# Patient Record
Sex: Female | Born: 1985 | Race: White | Hispanic: No | Marital: Married | Smoking: Never smoker
Health system: Southern US, Community
[De-identification: ages and names within clinical notes are randomized; demographics above are authoritative.]

## PROBLEM LIST (undated history)

## (undated) ENCOUNTER — Emergency Department (HOSPITAL_COMMUNITY): Payer: Self-pay

## (undated) DIAGNOSIS — F419 Anxiety disorder, unspecified: Secondary | ICD-10-CM

## (undated) HISTORY — PX: WISDOM TOOTH EXTRACTION: SHX21

---
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
## 2017-12-02 DIAGNOSIS — Z01419 Encounter for gynecological examination (general) (routine) without abnormal findings: Secondary | ICD-10-CM | POA: Diagnosis not present

## 2017-12-02 DIAGNOSIS — Z113 Encounter for screening for infections with a predominantly sexual mode of transmission: Secondary | ICD-10-CM | POA: Diagnosis not present

## 2017-12-02 DIAGNOSIS — Z6825 Body mass index (BMI) 25.0-25.9, adult: Secondary | ICD-10-CM | POA: Diagnosis not present

## 2018-01-13 DIAGNOSIS — Z6825 Body mass index (BMI) 25.0-25.9, adult: Secondary | ICD-10-CM | POA: Diagnosis not present

## 2018-01-13 DIAGNOSIS — Z30430 Encounter for insertion of intrauterine contraceptive device: Secondary | ICD-10-CM | POA: Diagnosis not present

## 2018-01-13 DIAGNOSIS — Z3202 Encounter for pregnancy test, result negative: Secondary | ICD-10-CM | POA: Diagnosis not present

## 2018-07-21 DIAGNOSIS — Z0000 Encounter for general adult medical examination without abnormal findings: Secondary | ICD-10-CM | POA: Diagnosis not present

## 2018-07-21 DIAGNOSIS — R82998 Other abnormal findings in urine: Secondary | ICD-10-CM | POA: Diagnosis not present

## 2018-07-27 DIAGNOSIS — Z0000 Encounter for general adult medical examination without abnormal findings: Secondary | ICD-10-CM | POA: Diagnosis not present

## 2018-07-27 DIAGNOSIS — Z6826 Body mass index (BMI) 26.0-26.9, adult: Secondary | ICD-10-CM | POA: Diagnosis not present

## 2018-07-27 DIAGNOSIS — Z1389 Encounter for screening for other disorder: Secondary | ICD-10-CM | POA: Diagnosis not present

## 2018-07-27 DIAGNOSIS — Z23 Encounter for immunization: Secondary | ICD-10-CM | POA: Diagnosis not present

## 2019-01-04 DIAGNOSIS — Z01419 Encounter for gynecological examination (general) (routine) without abnormal findings: Secondary | ICD-10-CM | POA: Diagnosis not present

## 2019-01-04 DIAGNOSIS — Z113 Encounter for screening for infections with a predominantly sexual mode of transmission: Secondary | ICD-10-CM | POA: Diagnosis not present

## 2019-01-04 DIAGNOSIS — Z6826 Body mass index (BMI) 26.0-26.9, adult: Secondary | ICD-10-CM | POA: Diagnosis not present

## 2019-04-18 DIAGNOSIS — Z111 Encounter for screening for respiratory tuberculosis: Secondary | ICD-10-CM | POA: Diagnosis not present

## 2019-04-20 DIAGNOSIS — Z111 Encounter for screening for respiratory tuberculosis: Secondary | ICD-10-CM | POA: Diagnosis not present

## 2019-05-04 DIAGNOSIS — H6592 Unspecified nonsuppurative otitis media, left ear: Secondary | ICD-10-CM | POA: Diagnosis not present

## 2019-05-04 DIAGNOSIS — R42 Dizziness and giddiness: Secondary | ICD-10-CM | POA: Diagnosis not present

## 2019-11-22 DIAGNOSIS — Z03818 Encounter for observation for suspected exposure to other biological agents ruled out: Secondary | ICD-10-CM | POA: Diagnosis not present

## 2020-09-05 DIAGNOSIS — Z23 Encounter for immunization: Secondary | ICD-10-CM | POA: Diagnosis not present

## 2020-10-03 DIAGNOSIS — Z319 Encounter for procreative management, unspecified: Secondary | ICD-10-CM | POA: Diagnosis not present

## 2020-10-03 DIAGNOSIS — Z30431 Encounter for routine checking of intrauterine contraceptive device: Secondary | ICD-10-CM | POA: Diagnosis not present

## 2020-10-17 DIAGNOSIS — Z6825 Body mass index (BMI) 25.0-25.9, adult: Secondary | ICD-10-CM | POA: Diagnosis not present

## 2020-10-17 DIAGNOSIS — Z13220 Encounter for screening for lipoid disorders: Secondary | ICD-10-CM | POA: Diagnosis not present

## 2020-10-17 DIAGNOSIS — Z3009 Encounter for other general counseling and advice on contraception: Secondary | ICD-10-CM | POA: Diagnosis not present

## 2020-10-17 DIAGNOSIS — Z0000 Encounter for general adult medical examination without abnormal findings: Secondary | ICD-10-CM | POA: Diagnosis not present

## 2021-11-29 NOTE — L&D Delivery Note (Signed)
Delivery Note Patient progressed along a normal labor curve to 10 cm.  Due to multiple prolonged decelerations, she labored down for 30 minutes.  She pushed well for just over an hour.  At 10:18 PM a viable female was delivered via Vaginal, Spontaneous (Presentation: Right Occiput Anterior).  APGAR: 8, 9; weight 3010 gm (6lb 10.2oz) .   Placenta status: Spontaneous, Intact.  Cord: 3 vessels with the following complications: None.  Cord pH: n/a  At 2002 prior to pushing, patient was noted to have a temperature of 100.41F.  Maternal and fetal heart rates were normal.  She received 1 gm of tylenol PO, but had immediate emesis of the tablets.  MD was not notified of emesis.  At 2153 during pushing, temperature was 102.5 F with fetal tachycardia in the 170s.  She was redosed with 1 gm of tylenol and ampicillin 2gm ordered for possible early intraamniotic infection.  Gentamicin was not ordered as delivery was likely to occur prior to the arrival of gentamicin to the floor.     Following delivery, a vulvar biopsy was obtained.  The left labia was cleansed with betadine and a 55mm vulvar punch biopsy performed of the thickened hypopigmented lesion.  The tissue was sent to pathology.  There was brisk bleeding from the biopsy site so two interrupted 3-0 Vicryl rapide stitches were placed   Anesthesia: Epidural;Local Episiotomy: None Lacerations: 2nd degree Suture Repair: 2.0 3.0 vicryl rapide Est. Blood Loss (mL):  152 mL  Mom to postpartum.  Baby to Couplet care / Skin to Skin.  Michelle Padilla Michelle Padilla 07/21/2022, 11:06 PM

## 2021-12-15 LAB — OB RESULTS CONSOLE GC/CHLAMYDIA
Chlamydia: NEGATIVE
Neisseria Gonorrhea: NEGATIVE

## 2022-01-01 ENCOUNTER — Inpatient Hospital Stay (HOSPITAL_COMMUNITY): Payer: No Typology Code available for payment source

## 2022-01-01 ENCOUNTER — Encounter (HOSPITAL_COMMUNITY): Payer: Self-pay | Admitting: Emergency Medicine

## 2022-01-01 ENCOUNTER — Inpatient Hospital Stay (HOSPITAL_COMMUNITY)
Admission: AD | Admit: 2022-01-01 | Discharge: 2022-01-01 | Disposition: A | Discharge status: Home / Self Care | Payer: No Typology Code available for payment source | Attending: Obstetrics and Gynecology | Admitting: Obstetrics and Gynecology

## 2022-01-01 ENCOUNTER — Other Ambulatory Visit: Payer: Self-pay

## 2022-01-01 DIAGNOSIS — Z3491 Encounter for supervision of normal pregnancy, unspecified, first trimester: Secondary | ICD-10-CM | POA: Diagnosis not present

## 2022-01-01 DIAGNOSIS — Z6740 Type O blood, Rh positive: Secondary | ICD-10-CM | POA: Insufficient documentation

## 2022-01-01 DIAGNOSIS — O209 Hemorrhage in early pregnancy, unspecified: Secondary | ICD-10-CM | POA: Insufficient documentation

## 2022-01-01 DIAGNOSIS — N939 Abnormal uterine and vaginal bleeding, unspecified: Secondary | ICD-10-CM

## 2022-01-01 DIAGNOSIS — Z3A10 10 weeks gestation of pregnancy: Secondary | ICD-10-CM | POA: Insufficient documentation

## 2022-01-01 DIAGNOSIS — O09511 Supervision of elderly primigravida, first trimester: Secondary | ICD-10-CM | POA: Diagnosis not present

## 2022-01-01 HISTORY — DX: Anxiety disorder, unspecified: F41.9

## 2022-01-01 LAB — URINALYSIS, ROUTINE W REFLEX MICROSCOPIC
Bilirubin Urine: NEGATIVE
Glucose, UA: NEGATIVE mg/dL
Ketones, ur: NEGATIVE mg/dL
Leukocytes,Ua: NEGATIVE
Nitrite: NEGATIVE
Protein, ur: NEGATIVE mg/dL
Specific Gravity, Urine: >1.03 — ABNORMAL HIGH (ref 1.005–1.030)
pH: 6 (ref 5.0–8.0)

## 2022-01-01 LAB — CBC
HCT: 36.5 % (ref 36.0–46.0)
Hemoglobin: 12.8 g/dL (ref 12.0–15.0)
MCH: 27.8 pg (ref 26.0–34.0)
MCHC: 35.1 g/dL (ref 30.0–36.0)
MCV: 79.2 fL — ABNORMAL LOW (ref 80.0–100.0)
Platelets: 133 10*3/uL — ABNORMAL LOW (ref 150–400)
RBC: 4.61 MIL/uL (ref 3.87–5.11)
RDW: 13.2 % (ref 11.5–15.5)
WBC: 6.3 10*3/uL (ref 4.0–10.5)
nRBC: 0 % (ref 0.0–0.2)

## 2022-01-01 LAB — ABO/RH: ABO/RH(D): O POS

## 2022-01-01 LAB — HEMOGLOBIN AND HEMATOCRIT, BLOOD
HCT: 35.9 % — ABNORMAL LOW (ref 36.0–46.0)
Hemoglobin: 12.5 g/dL (ref 12.0–15.0)

## 2022-01-01 LAB — URINALYSIS, MICROSCOPIC (REFLEX): RBC / HPF: >50 RBC/hpf (ref 0–5)

## 2022-01-01 LAB — I-STAT BETA HCG BLOOD, ED (MC, WL, AP ONLY): I-stat hCG, quantitative: >2000 m[IU]/mL — ABNORMAL HIGH (ref <5)

## 2022-01-01 LAB — HCG, QUANTITATIVE, PREGNANCY: hCG, Beta Chain, Quant, S: 177250 m[IU]/mL — ABNORMAL HIGH (ref <5)

## 2022-01-01 MED ORDER — ONDANSETRON 4 MG PO TBDP
4.0000 mg | ORAL_TABLET | Freq: Once | ORAL | Status: AC
  Administered 2022-01-01: 4 mg via ORAL
  Filled 2022-01-01: qty 1

## 2022-01-01 NOTE — ED Notes (Signed)
Pt given urine sample now.

## 2022-01-01 NOTE — ED Provider Triage Note (Signed)
Emergency Medicine Provider Triage Evaluation Note  Michelle Padilla , a 36 y.o. female  was evaluated in triage.  Pt complains of vaginal bleeding onset last night.  She is currently approximately [redacted] weeks pregnant.  Her OB is Dr. Carlis Abbott at [facility].  She had her first OB appointment 2 and half weeks ago. She is G1, P0.  Denies abdominal pain, nausea, vomiting.  Review of Systems  Positive: As per HPI above Negative: Abdominal pain, nausea, vomiting  Physical Exam  BP 136/89 (BP Location: Left Arm)    Pulse (!) 107    Temp 98.5 F (36.9 C)    Resp 14    LMP 10/19/2021 (Exact Date)    SpO2 100%  Gen:   Awake, no distress, tearful during exam. Resp:  Normal effort  MSK:   Moves extremities without difficulty  Other:  No abdominal tenderness to palpation.  Medical Decision Making  Medically screening exam initiated at 2:22 PM.  Appropriate orders placed.  Michelle Padilla was informed that the remainder of the evaluation will be completed by another provider, this initial triage assessment does not replace that evaluation, and the importance of remaining in the ED until their evaluation is complete.  2:51 PM -called MAU and spoke with Michelle Padilla who accepts the patient in transfer.     Michelle Padilla A, PA-C 01/01/22 1452

## 2022-01-01 NOTE — MAU Note (Signed)
Presents with c/o heavy VB that began approximately 3 hours, states no abdominal pain/cramping but has passed 3 egg sized clots.  LMP 10/19/2021.  +HPT.

## 2022-01-01 NOTE — ED Triage Notes (Signed)
Patient reports heavy vaginal bleeding since 1230 today. Denies any pain, LMP 10/19/2022. G1P0. Patient alert, oriented, tearful at this time.

## 2022-01-01 NOTE — MAU Provider Note (Signed)
History     CSN: [id number]  Arrival date and time: 01/01/22 1342   Event Date/Time   First Provider Initiated Contact with Patient 01/01/22 1819      Chief Complaint  Patient presents with   Vaginal Bleeding   HPI [place] is a 36 y.o. G1P0 at [redacted]w[redacted]d who presents to MAU from [facility] for evaluation of heavy vaginal bleeding. This is a new problem, onset earlier this afternoon. Patient reports passing 3-4 egg-sized clots bout three hours hour prior to her arrival at [facility]. She has not experienced pain at any time. She reports live IUP in office during her first prenatal visit about two weeks ago. She denies dizziness, weakness, syncope.  Patient has initiated care with [facility].    OB History     Gravida  1   Para      Term      Preterm      AB      Living         SAB      IAB      Ectopic      Multiple      Live Births              Past Medical History:  Diagnosis Date   Anxiety       Family History  Problem Relation Age of Onset   Hypertension Mother    Seizures Father     Social History   Tobacco Use   Smoking status: Never   Smokeless tobacco: Never  Vaping Use   Vaping Use: Never used  Substance Use Topics   Alcohol use: Not Currently    Comment: Social   Drug use: Never    Allergies: No Known Allergies  Medications Prior to Admission  Medication Sig Dispense Refill Last Dose   Prenatal Vit-Fe Fumarate-FA (MULTIVITAMIN-PRENATAL) 27-0.8 MG TABS tablet Take 1 tablet by mouth daily at 12 noon.   12/31/2021 at 1800    Review of Systems  Genitourinary:  Positive for vaginal bleeding.  All other systems reviewed and are negative. Physical Exam   Blood pressure 120/90, pulse (!) 124, temperature 98.8 F (37.1 C), temperature source Oral, resp. rate 16, last menstrual period 10/19/2021, SpO2 100 %.  Physical Exam Vitals and nursing note reviewed. Exam conducted with a chaperone present.  Constitutional:      Appearance:  Normal appearance.  Cardiovascular:     Rate and Rhythm: Normal rate and regular rhythm.     Pulses: Normal pulses.     Heart sounds: Normal heart sounds.  Pulmonary:     Effort: Pulmonary effort is normal.     Breath sounds: Normal breath sounds.  Abdominal:     Palpations: Abdomen is soft.     Tenderness: There is no right CVA tenderness or left CVA tenderness.  Genitourinary:    Comments: Pelvic exam: External genitalia normal, vaginal walls pink and well rugated, cervix visually closed, no lesions noted. One large dark red clot removed with fox swab x 1. Scant active bleeding, cervix visually closed.    Skin:    Capillary Refill: Capillary refill takes less than 2 seconds.  Neurological:     Mental Status: She is alert.  Psychiatric:        Mood and Affect: Mood normal.        Behavior: Behavior normal.        Thought Content: Thought content normal.        Judgment: Judgment  normal.    MAU Course/MDM  Procedures  --Patient with one episode of vomiting. Given Zofran ODT. Patient reports she typically has emesis when she gets hungry. Declines outpatient prescription for antiemetic  Orders Placed This Encounter  Procedures   US OB Comp Less 14 Wks   CBC   hCG, quantitative, pregnancy   Urinalysis, Routine w reflex microscopic Urine, Clean Catch   Urinalysis, Microscopic (reflex)   Hemoglobin and hematocrit, blood   Diet NPO time specified   I-Stat beta hCG blood, ED   ABO/Rh   Discharge patient   Patient Vitals for the past 24 hrs:  BP Temp Temp src Pulse Resp SpO2  01/01/22 1840 120/90 -- -- (!) 124 -- 100 %  01/01/22 1531 114/79 98.8 F (37.1 C) Oral (!) 102 16 100 %  01/01/22 1350 136/89 98.5 F (36.9 C) -- (!) 107 14 100 %   Results for orders placed or performed during the hospital encounter of 01/01/22 (from the past 24 hour(s))  I-Stat beta hCG blood, ED     Status: Abnormal   Collection Time: 01/01/22  2:36 PM  Result Value Ref Range   I-stat hCG,  quantitative >2,000.0 (H) <5 mIU/mL   Comment 3          Urinalysis, Routine w reflex microscopic Urine, Clean Catch     Status: Abnormal   Collection Time: 01/01/22  3:35 PM  Result Value Ref Range   Color, Urine YELLOW YELLOW   APPearance CLEAR CLEAR   Specific Gravity, Urine >1.030 (H) 1.005 - 1.030   pH 6.0 5.0 - 8.0   Glucose, UA NEGATIVE NEGATIVE mg/dL   Hgb urine dipstick LARGE (A) NEGATIVE   Bilirubin Urine NEGATIVE NEGATIVE   Ketones, ur NEGATIVE NEGATIVE mg/dL   Protein, ur NEGATIVE NEGATIVE mg/dL   Nitrite NEGATIVE NEGATIVE   Leukocytes,Ua NEGATIVE NEGATIVE  Urinalysis, Microscopic (reflex)     Status: Abnormal   Collection Time: 01/01/22  3:35 PM  Result Value Ref Range   RBC / HPF >50 0 - 5 RBC/hpf   WBC, UA 6-10 0 - 5 WBC/hpf   Bacteria, UA RARE (A) NONE SEEN   Squamous Epithelial / LPF 0-5 0 - 5   Mucus PRESENT    Ca Oxalate Crys, UA PRESENT   CBC     Status: Abnormal   Collection Time: 01/01/22  4:31 PM  Result Value Ref Range   WBC 6.3 4.0 - 10.5 K/uL   RBC 4.61 3.87 - 5.11 MIL/uL   Hemoglobin 12.8 12.0 - 15.0 g/dL   HCT 36.5 36.0 - 46.0 %   MCV 79.2 (L) 80.0 - 100.0 fL   MCH 27.8 26.0 - 34.0 pg   MCHC 35.1 30.0 - 36.0 g/dL   RDW 13.2 11.5 - 15.5 %   Platelets 133 (L) 150 - 400 K/uL   nRBC 0.0 0.0 - 0.2 %  hCG, quantitative, pregnancy     Status: Abnormal   Collection Time: 01/01/22  4:31 PM  Result Value Ref Range   hCG, Beta Chain, Quant, S 177,250 (H) <5 mIU/mL  ABO/Rh     Status: None   Collection Time: 01/01/22  4:31 PM  Result Value Ref Range   ABO/RH(D) O POS    No rh immune globuloin      NOT A RH IMMUNE GLOBULIN CANDIDATE, PT RH POSITIVE Performed at [facility], [address], [place], [place] [address]   Hemoglobin and hematocrit, blood  Status: Abnormal   Collection Time: 01/01/22  6:28 PM  Result Value Ref Range   Hemoglobin 12.5 12.0 - 15.0 g/dL   HCT 35.9 (L) 36.0 - 46.0 %   US OB Comp Less 14 Wks  Result Date:  01/01/2022 CLINICAL DATA:  Heavy bleeding, pregnant EXAM: OBSTETRIC <14 WK ULTRASOUND TECHNIQUE: Transabdominal ultrasound was performed for evaluation of the gestation as well as the maternal uterus and adnexal regions. COMPARISON:  None. FINDINGS: Intrauterine gestational sac: Single Yolk sac:  Visualized. Embryo:  Visualized. Cardiac Activity: Visualized. Heart Rate: 182 bpm CRL:   36.3 mm   10 w 4 d                  [place] EDC: 07/06/2022 Subchorionic hemorrhage:  None visualized. Maternal uterus/adnexae: Right ovary is normal. Left ovary is not visualized. Blood product in the vaginal vault. IMPRESSION: 1. Single intrauterine gestation at sonographic gestational age of [redacted] weeks, 4 days. Fetal heart rate 182 beats per minute. EDD 07/26/2022. 2.  Blood product in the vaginal vault. Electronically Signed   By: Delanna Ahmadi M.D.   On: 01/01/2022 18:12    Assessment and Plan  --36 y.o. G1P0 at [redacted]w[redacted]d  --Live IUP confirmed during MAU encounter --Ongoing scant but consistent bleeding --Reviewed bleeding precautions, summarized in AVS --Rechecked H&H prior to discharge 12.8>12.5 --Blood type O POS --Dr. Marvel Plan notified of assessment, possible threatened miscarriage --Discharge home in stable condition  Grafton Folk, MSN, CNM

## 2022-01-06 LAB — OB RESULTS CONSOLE RUBELLA ANTIBODY, IGM: Rubella: IMMUNE

## 2022-01-06 LAB — OB RESULTS CONSOLE RPR: RPR: NONREACTIVE

## 2022-01-06 LAB — HEPATITIS C ANTIBODY: HCV Ab: NEGATIVE

## 2022-01-06 LAB — OB RESULTS CONSOLE HIV ANTIBODY (ROUTINE TESTING): HIV: NONREACTIVE

## 2022-01-06 LAB — OB RESULTS CONSOLE ANTIBODY SCREEN: Antibody Screen: NEGATIVE

## 2022-01-06 LAB — OB RESULTS CONSOLE HEPATITIS B SURFACE ANTIGEN: Hepatitis B Surface Ag: NEGATIVE

## 2022-03-02 ENCOUNTER — Other Ambulatory Visit: Payer: Self-pay | Admitting: Obstetrics

## 2022-03-02 DIAGNOSIS — Z3689 Encounter for other specified antenatal screening: Secondary | ICD-10-CM

## 2022-03-04 ENCOUNTER — Other Ambulatory Visit: Payer: Self-pay

## 2022-03-09 ENCOUNTER — Ambulatory Visit
Payer: No Typology Code available for payment source | Attending: Maternal & Fetal Medicine | Admitting: Maternal & Fetal Medicine

## 2022-03-09 ENCOUNTER — Other Ambulatory Visit: Payer: Self-pay | Admitting: *Deleted

## 2022-03-09 ENCOUNTER — Ambulatory Visit (HOSPITAL_BASED_OUTPATIENT_CLINIC_OR_DEPARTMENT_OTHER): Payer: No Typology Code available for payment source

## 2022-03-09 ENCOUNTER — Ambulatory Visit: Payer: No Typology Code available for payment source | Attending: Obstetrics | Admitting: *Deleted

## 2022-03-09 ENCOUNTER — Encounter: Payer: Self-pay | Admitting: *Deleted

## 2022-03-09 VITALS — BP 111/72 | HR 81 | Ht 64.0 in

## 2022-03-09 DIAGNOSIS — Z7982 Long term (current) use of aspirin: Secondary | ICD-10-CM | POA: Diagnosis not present

## 2022-03-09 DIAGNOSIS — Q369 Cleft lip, unilateral: Secondary | ICD-10-CM

## 2022-03-09 DIAGNOSIS — Z3A20 20 weeks gestation of pregnancy: Secondary | ICD-10-CM | POA: Insufficient documentation

## 2022-03-09 DIAGNOSIS — O09522 Supervision of elderly multigravida, second trimester: Secondary | ICD-10-CM | POA: Insufficient documentation

## 2022-03-09 DIAGNOSIS — O283 Abnormal ultrasonic finding on antenatal screening of mother: Secondary | ICD-10-CM

## 2022-03-09 DIAGNOSIS — Z363 Encounter for antenatal screening for malformations: Secondary | ICD-10-CM | POA: Diagnosis present

## 2022-03-09 DIAGNOSIS — Z3689 Encounter for other specified antenatal screening: Secondary | ICD-10-CM

## 2022-03-09 DIAGNOSIS — Z362 Encounter for other antenatal screening follow-up: Secondary | ICD-10-CM

## 2022-03-09 DIAGNOSIS — O35AXX0 Maternal care for other (suspected) fetal abnormality and damage, fetal facial anomalies, not applicable or unspecified: Secondary | ICD-10-CM

## 2022-03-09 NOTE — Progress Notes (Signed)
MFM Consultation. ? ?Michelle Padilla is a 36 yo G1P0 at 24 w 1 d who is were with an EDD of 07/26/22. She is seen at the request of Royetta Crochet, MD regarding a finding of unilateral cleft lip. ? ?She is doing well today without s/sx of preterm labor or preeclampsia. ? ?She a low risk NIPS and AFP. ? ?Her Blood pressure was 111/72. ? ?Past Medical History:  ?Diagnosis Date  ? Anxiety   ? ?Past Surgical History:  ?Procedure Laterality Date  ? WISDOM TOOTH EXTRACTION Bilateral   ? ? ? ? ? ?Current Outpatient Medications (Analgesics):  ?  aspirin EC 81 MG tablet, Take 81 mg by mouth daily. Swallow whole. ? ? ?Current Outpatient Medications (Other):  ?  Prenatal Vit-Fe Fumarate-FA (MULTIVITAMIN-PRENATAL) 27-0.8 MG TABS tablet, Take 1 tablet by mouth daily at 12 noon. ?  VITAMIN D PO, Take by mouth. ?Social History  ? ?Socioeconomic History  ? Marital status: Married  ?  Spouse name: Not on file  ? Number of children: Not on file  ? Years of education: Not on file  ? Highest education level: Not on file  ?Occupational History  ? Not on file  ?Tobacco Use  ? Smoking status: Never  ? Smokeless tobacco: Never  ?Vaping Use  ? Vaping Use: Never used  ?Substance and Sexual Activity  ? Alcohol use: Not Currently  ?  Comment: Social  ? Drug use: Never  ? Sexual activity: Yes  ?Other Topics Concern  ? Not on file  ?Social History Narrative  ? Not on file  ? ?Social Determinants of Health  ? ?Financial Resource Strain: Not on file  ?Food Insecurity: Not on file  ?Transportation Needs: Not on file  ?Physical Activity: Not on file  ?Stress: Not on file  ?Social Connections: Not on file  ?Intimate Partner Violence: Not on file  ? ?No Known Allergies ?Family History  ?Problem Relation Age of Onset  ? Hypertension Mother   ? Seizures Father   ? ? ?Imaging today: ? ?We observed a single intrauterine pregnancy with measurements consistent with dates. ?The fetal anatomy was normal with exception of a right unilateral cleft lip. The fetal  palate was suboptimally seen. ?Suboptimal views of the fetal heart was also observed.  ? ?There was good fetal movement and amniotic fluid volume. ? ?Impression/Counseling: ? ?Cleft lip and palate is a common facial malformation that typically runs between the nostrils and may involve the central part of the posterior palate. Linear defect extending from upper lip to the nostril, while midline clefts are often associated with brain malformations (e.g., holoprosencephaly). The majority of cases have a multifactorial etiology and inheritance. Additional risk factors, including hyperthermia, chronic steroids, methotrexate, alcohol, hydantoin, trimethadione, and aminopterin, maternal rubella, phenylketonuria, folic acid deficiency, and zinc deficiency were reviewed.  ? ?In general, the prognosis for a good cosmetic and functional repair with isolated cleft lip and/or cleft palate is excellent.  Otherwise, the prognosis is dependent upon any associated malformations or a syndrome diagnosis.  The risks, benefits, and alternatives of invasive karyotype analysis in the presence of a cleft lip were reviewed with your patient. We quote a risk for fetal loss as a result of an amniocentesis of approximately 1/500 and highlighted that normal test results do not necessarily guarantee the birth of a normal infant as the general population risk of having a child with any congenital abnormality is approximately 3-4%.  At the conclusion of our discussion, Ms. Kruschke made an  informed decision to forgo further testing.   ? ?At this time we recommend follow-up in 4 weeks, at which time for fetal growth assessment.  Thereafter, serial ultrasounds for growth, as well as evaluation of polyhydramnios may be considered.  As far as delivery, the site for delivery should be where there are appropriate facilities and support staff for care and management of an infant with a cleft (possible respiratory and feeding complications).   ? ?All  questions answered.  ? ?I spent 30 minutes with > 50% in face to face consultation. ? ?Vikki Ports, MD ?

## 2022-03-24 ENCOUNTER — Other Ambulatory Visit: Payer: No Typology Code available for payment source

## 2022-03-24 ENCOUNTER — Ambulatory Visit: Payer: No Typology Code available for payment source

## 2022-04-09 ENCOUNTER — Ambulatory Visit: Payer: No Typology Code available for payment source | Attending: Maternal & Fetal Medicine

## 2022-04-09 ENCOUNTER — Ambulatory Visit: Payer: No Typology Code available for payment source | Admitting: *Deleted

## 2022-04-09 ENCOUNTER — Encounter: Payer: Self-pay | Admitting: *Deleted

## 2022-04-09 VITALS — BP 122/65 | HR 71

## 2022-04-09 DIAGNOSIS — O09522 Supervision of elderly multigravida, second trimester: Secondary | ICD-10-CM

## 2022-04-09 DIAGNOSIS — O09512 Supervision of elderly primigravida, second trimester: Secondary | ICD-10-CM | POA: Diagnosis present

## 2022-04-09 DIAGNOSIS — Z3A24 24 weeks gestation of pregnancy: Secondary | ICD-10-CM

## 2022-04-09 DIAGNOSIS — Z362 Encounter for other antenatal screening follow-up: Secondary | ICD-10-CM | POA: Insufficient documentation

## 2022-04-09 DIAGNOSIS — O35AXX0 Maternal care for other (suspected) fetal abnormality and damage, fetal facial anomalies, not applicable or unspecified: Secondary | ICD-10-CM

## 2022-05-24 DIAGNOSIS — O35AXX1 Maternal care for other (suspected) fetal abnormality and damage, fetal facial anomalies, fetus 1: Secondary | ICD-10-CM | POA: Insufficient documentation

## 2022-07-02 LAB — OB RESULTS CONSOLE GBS: GBS: NEGATIVE

## 2022-07-14 ENCOUNTER — Telehealth (HOSPITAL_COMMUNITY): Payer: Self-pay | Admitting: *Deleted

## 2022-07-14 ENCOUNTER — Encounter (HOSPITAL_COMMUNITY): Payer: Self-pay | Admitting: *Deleted

## 2022-07-14 ENCOUNTER — Encounter (HOSPITAL_COMMUNITY): Payer: Self-pay

## 2022-07-14 NOTE — Telephone Encounter (Signed)
Preadmission screen  

## 2022-07-20 ENCOUNTER — Other Ambulatory Visit: Payer: Self-pay

## 2022-07-21 ENCOUNTER — Inpatient Hospital Stay (HOSPITAL_COMMUNITY): Payer: No Typology Code available for payment source

## 2022-07-21 ENCOUNTER — Encounter (HOSPITAL_COMMUNITY): Payer: Self-pay | Admitting: Obstetrics and Gynecology

## 2022-07-21 ENCOUNTER — Inpatient Hospital Stay (HOSPITAL_COMMUNITY): Payer: No Typology Code available for payment source | Admitting: Anesthesiology

## 2022-07-21 ENCOUNTER — Inpatient Hospital Stay (HOSPITAL_COMMUNITY)
Admission: AD | Admit: 2022-07-21 | Discharge: 2022-07-23 | DRG: 768 | Disposition: A | Discharge status: Home / Self Care | Payer: No Typology Code available for payment source | Attending: Obstetrics | Admitting: Obstetrics

## 2022-07-21 DIAGNOSIS — Z3A39 39 weeks gestation of pregnancy: Secondary | ICD-10-CM | POA: Diagnosis not present

## 2022-07-21 DIAGNOSIS — Z3490 Encounter for supervision of normal pregnancy, unspecified, unspecified trimester: Secondary | ICD-10-CM

## 2022-07-21 DIAGNOSIS — O2686 Pruritic urticarial papules and plaques of pregnancy (PUPPP): Secondary | ICD-10-CM | POA: Diagnosis present

## 2022-07-21 DIAGNOSIS — N9089 Other specified noninflammatory disorders of vulva and perineum: Secondary | ICD-10-CM | POA: Diagnosis present

## 2022-07-21 DIAGNOSIS — O411230 Chorioamnionitis, third trimester, not applicable or unspecified: Secondary | ICD-10-CM | POA: Diagnosis present

## 2022-07-21 DIAGNOSIS — O3473 Maternal care for abnormality of vulva and perineum, third trimester: Principal | ICD-10-CM | POA: Diagnosis present

## 2022-07-21 DIAGNOSIS — O358XX0 Maternal care for other (suspected) fetal abnormality and damage, not applicable or unspecified: Secondary | ICD-10-CM | POA: Diagnosis present

## 2022-07-21 DIAGNOSIS — O26893 Other specified pregnancy related conditions, third trimester: Secondary | ICD-10-CM | POA: Diagnosis present

## 2022-07-21 LAB — CBC
HCT: 36.2 % (ref 36.0–46.0)
Hemoglobin: 11.5 g/dL — ABNORMAL LOW (ref 12.0–15.0)
MCH: 23 pg — ABNORMAL LOW (ref 26.0–34.0)
MCHC: 31.8 g/dL (ref 30.0–36.0)
MCV: 72.3 fL — ABNORMAL LOW (ref 80.0–100.0)
Platelets: 189 10*3/uL (ref 150–400)
RBC: 5.01 MIL/uL (ref 3.87–5.11)
RDW: 14.7 % (ref 11.5–15.5)
WBC: 9.9 10*3/uL (ref 4.0–10.5)
nRBC: 0 % (ref 0.0–0.2)

## 2022-07-21 LAB — COMPREHENSIVE METABOLIC PANEL
ALT: 10 U/L (ref 0–44)
AST: 19 U/L (ref 15–41)
Albumin: 2.9 g/dL — ABNORMAL LOW (ref 3.5–5.0)
Alkaline Phosphatase: 131 U/L — ABNORMAL HIGH (ref 38–126)
Anion gap: 9 (ref 5–15)
BUN: 13 mg/dL (ref 6–20)
CO2: 20 mmol/L — ABNORMAL LOW (ref 22–32)
Calcium: 9.3 mg/dL (ref 8.9–10.3)
Chloride: 108 mmol/L (ref 98–111)
Creatinine, Ser: 0.8 mg/dL (ref 0.44–1.00)
GFR, Estimated: >60 mL/min (ref >60)
Glucose, Bld: 92 mg/dL (ref 70–99)
Potassium: 4.2 mmol/L (ref 3.5–5.1)
Sodium: 137 mmol/L (ref 135–145)
Total Bilirubin: 0.2 mg/dL — ABNORMAL LOW (ref 0.3–1.2)
Total Protein: 5.3 g/dL — ABNORMAL LOW (ref 6.5–8.1)

## 2022-07-21 LAB — RPR: RPR Ser Ql: NONREACTIVE

## 2022-07-21 LAB — TYPE AND SCREEN
ABO/RH(D): O POS
Antibody Screen: NEGATIVE

## 2022-07-21 MED ORDER — LACTATED RINGERS IV SOLN: 500.0000 mL | Freq: Once | INTRAVENOUS | Status: DC

## 2022-07-21 MED ORDER — TERBUTALINE SULFATE 1 MG/ML IJ SOLN
0.2500 mg | Freq: Once | INTRAMUSCULAR | Status: AC | PRN
Start: 2022-07-21 — End: 2022-07-21
  Administered 2022-07-21: 0.25 mg via SUBCUTANEOUS

## 2022-07-21 MED ORDER — FENTANYL-BUPIVACAINE-NACL 0.5-0.125-0.9 MG/250ML-% EP SOLN
12.0000 mL/h | EPIDURAL | Status: DC | PRN
  Administered 2022-07-21: 12 mL/h via EPIDURAL
  Filled 2022-07-21: qty 250

## 2022-07-21 MED ORDER — LIDOCAINE HCL (PF) 1 % IJ SOLN
INTRAMUSCULAR | Status: DC | PRN
  Administered 2022-07-21 (×2): 5 mL via EPIDURAL

## 2022-07-21 MED ORDER — LACTATED RINGERS IV SOLN: INTRAVENOUS | Status: DC

## 2022-07-21 MED ORDER — OXYTOCIN-SODIUM CHLORIDE 30-0.9 UT/500ML-% IV SOLN
1.0000 m[IU]/min | INTRAVENOUS | Status: DC
  Administered 2022-07-21: 2 m[IU]/min via INTRAVENOUS
  Administered 2022-07-21: 1 m[IU]/min via INTRAVENOUS

## 2022-07-21 MED ORDER — OXYCODONE-ACETAMINOPHEN 5-325 MG PO TABS: 1.0000 | ORAL_TABLET | ORAL | Status: DC | PRN

## 2022-07-21 MED ORDER — PHENYLEPHRINE 80 MCG/ML (10ML) SYRINGE FOR IV PUSH (FOR BLOOD PRESSURE SUPPORT)
80.0000 ug | PREFILLED_SYRINGE | INTRAVENOUS | Status: DC | PRN
  Filled 2022-07-21: qty 10

## 2022-07-21 MED ORDER — ACETAMINOPHEN 500 MG PO TABS
1000.0000 mg | ORAL_TABLET | Freq: Four times a day (QID) | ORAL | Status: DC | PRN
  Administered 2022-07-21: 1000 mg via ORAL
  Filled 2022-07-21: qty 2

## 2022-07-21 MED ORDER — ACETAMINOPHEN 500 MG PO TABS
1000.0000 mg | ORAL_TABLET | Freq: Once | ORAL | Status: AC
  Administered 2022-07-21: 1000 mg via ORAL
  Filled 2022-07-21: qty 2

## 2022-07-21 MED ORDER — OXYTOCIN BOLUS FROM INFUSION
333.0000 mL | Freq: Once | INTRAVENOUS | Status: AC
  Administered 2022-07-21: 333 mL via INTRAVENOUS

## 2022-07-21 MED ORDER — MISOPROSTOL 25 MCG QUARTER TABLET
25.0000 ug | ORAL_TABLET | ORAL | Status: DC | PRN
  Administered 2022-07-21 (×2): 25 ug via VAGINAL
  Filled 2022-07-21 (×2): qty 1

## 2022-07-21 MED ORDER — SODIUM CHLORIDE 0.9 % IV SOLN
2.0000 g | Freq: Four times a day (QID) | INTRAVENOUS | Status: DC
  Administered 2022-07-21 – 2022-07-22 (×5): 2 g via INTRAVENOUS
  Filled 2022-07-21 (×6): qty 2000

## 2022-07-21 MED ORDER — PHENYLEPHRINE 80 MCG/ML (10ML) SYRINGE FOR IV PUSH (FOR BLOOD PRESSURE SUPPORT)
80.0000 ug | PREFILLED_SYRINGE | INTRAVENOUS | Status: AC | PRN
  Administered 2022-07-21 (×3): 80 ug via INTRAVENOUS

## 2022-07-21 MED ORDER — OXYCODONE-ACETAMINOPHEN 5-325 MG PO TABS: 2.0000 | ORAL_TABLET | ORAL | Status: DC | PRN

## 2022-07-21 MED ORDER — EPHEDRINE 5 MG/ML INJ
10.0000 mg | INTRAVENOUS | Status: DC | PRN
  Filled 2022-07-21: qty 5

## 2022-07-21 MED ORDER — ACETAMINOPHEN 325 MG PO TABS: 650.0000 mg | ORAL_TABLET | ORAL | Status: DC | PRN

## 2022-07-21 MED ORDER — OXYTOCIN-SODIUM CHLORIDE 30-0.9 UT/500ML-% IV SOLN
2.5000 [IU]/h | INTRAVENOUS | Status: DC
  Filled 2022-07-21: qty 500

## 2022-07-21 MED ORDER — OXYTOCIN-SODIUM CHLORIDE 30-0.9 UT/500ML-% IV SOLN
1.0000 m[IU]/min | INTRAVENOUS | Status: DC
  Administered 2022-07-21 (×2): 4 m[IU]/min via INTRAVENOUS

## 2022-07-21 MED ORDER — SOD CITRATE-CITRIC ACID 500-334 MG/5ML PO SOLN: 30.0000 mL | ORAL | Status: DC | PRN

## 2022-07-21 MED ORDER — TERBUTALINE SULFATE 1 MG/ML IJ SOLN
0.2500 mg | Freq: Once | INTRAMUSCULAR | Status: DC | PRN
  Filled 2022-07-21: qty 1

## 2022-07-21 MED ORDER — LACTATED RINGERS IV SOLN
500.0000 mL | INTRAVENOUS | Status: DC | PRN
  Administered 2022-07-21: 1000 mL via INTRAVENOUS

## 2022-07-21 MED ORDER — DIPHENHYDRAMINE HCL 50 MG/ML IJ SOLN: 12.5000 mg | INTRAMUSCULAR | Status: DC | PRN

## 2022-07-21 MED ORDER — LIDOCAINE HCL (PF) 1 % IJ SOLN
30.0000 mL | INTRAMUSCULAR | Status: AC | PRN
  Administered 2022-07-21: 30 mL via SUBCUTANEOUS
  Filled 2022-07-21: qty 30

## 2022-07-21 MED ORDER — EPHEDRINE 5 MG/ML INJ: 10.0000 mg | INTRAVENOUS | Status: DC | PRN

## 2022-07-21 MED ORDER — ONDANSETRON HCL 4 MG/2ML IJ SOLN
4.0000 mg | Freq: Four times a day (QID) | INTRAMUSCULAR | Status: DC | PRN
  Administered 2022-07-21 (×2): 4 mg via INTRAVENOUS
  Filled 2022-07-21 (×2): qty 2

## 2022-07-21 NOTE — Anesthesia Procedure Notes (Signed)
Epidural Patient location during procedure: OB Start time: 07/21/2022 12:42 PM End time: 07/21/2022 12:53 PM  Staffing Anesthesiologist: Achille Rich, MD Performed: anesthesiologist   Preanesthetic Checklist Completed: patient identified, IV checked, site marked, risks and benefits discussed, monitors and equipment checked, pre-op evaluation and timeout performed  Epidural Patient position: sitting Prep: DuraPrep Patient monitoring: heart rate, cardiac monitor, continuous pulse ox and blood pressure Approach: midline Location: L2-L3 Injection technique: LOR saline  Needle:  Needle type: Tuohy  Needle gauge: 17 G Needle length: 9 cm Needle insertion depth: 5 cm Catheter type: closed end flexible Catheter size: 19 Gauge Catheter at skin depth: 12 cm Test dose: negative and Other  Assessment Events: blood not aspirated, injection not painful, no injection resistance and negative IV test  Additional Notes Informed consent obtained prior to proceeding including risk of failure, 1% risk of PDPH, risk of minor discomfort and bruising.  Discussed rare but serious complications including epidural abscess, permanent nerve injury, epidural hematoma.  Discussed alternatives to epidural analgesia and patient desires to proceed.  Timeout performed pre-procedure verifying patient name, procedure, and platelet count.  Patient tolerated procedure well. Reason for block:procedure for pain

## 2022-07-21 NOTE — H&P (Signed)
36 y.o. G1P0 @ [redacted]w[redacted]d presents for  elective induction of labor at term.  Otherwise has good fetal movement and no bleeding.  Starting to feel some mild contractions. She received two doses of misoprostol overnight.  Had a spontaneous deceleration x 3 minutes when ambulating to bathroom, with spontaneous return to baseline.  Category 1 tracing since that time.  Report that there is an area of hypopigmentation of the vulva with itching that she has noticed for the last few weeks--has improved with triamcinolone  Pregnancy complicated by: Advanced maternal age: low risk NIPT.  Most recent growth [place] on 8/9 at [redacted]w[redacted]d:  3048gm (6#12) 45%, AFI 12.4 cm  Unilateral cleft lip.  Was referred to cranifacial team at [facility] for consultation for repair  Past Medical History:  Diagnosis Date   Anxiety     Past Surgical History:  Procedure Laterality Date   WISDOM TOOTH EXTRACTION Bilateral     OB History  Gravida Para Term Preterm AB Living  1            SAB IAB Ectopic Multiple Live Births               # Outcome Date GA Lbr Len/2nd Weight Sex Delivery Anes PTL Lv  1 Current             Social History   Socioeconomic History   Marital status: Married    Spouse name: Not on file   Number of children: Not on file   Years of education: Not on file   Highest education level: Not on file  Occupational History   Not on file  Tobacco Use   Smoking status: Never   Smokeless tobacco: Never  Vaping Use   Vaping Use: Never used  Substance and Sexual Activity   Alcohol use: Not Currently    Comment: Social   Drug use: Never   Sexual activity: Yes  Other Topics Concern   Not on file  Social History Narrative   Not on file   Social Determinants of Health   Financial Resource Strain: Not on file  Food Insecurity: Not on file  Transportation Needs: Not on file  Physical Activity: Not on file  Stress: Not on file  Social Connections: Not on file  Intimate Partner Violence: Not on file    Latex    Prenatal Transfer Tool  Maternal Diabetes: No Genetic Screening: Normal Maternal Ultrasounds/Referrals: Other: unilateral cleft lip Fetal Ultrasounds or other Referrals:  Referred to Materal Fetal Medicine  Maternal Substance Abuse:  No Significant Maternal Medications:  None Significant Maternal Lab Results: Group B Strep negative  ABO, Rh: --/--/O POS (08/23 0015) Antibody: NEG (08/23 0015) Rubella: Immune (02/08 0000) RPR: Nonreactive (02/08 0000)  HBsAg: Negative (02/08 0000)  HIV: Non-reactive (02/08 0000)  GBS: Negative/-- (08/04 0000)    Vitals:   07/21/22 0402 07/21/22 0653  BP: (!) 129/94 137/80  Pulse: (!) 49 (!) 46  Resp: 18 17  Temp: 98.1 F (36.7 C) 98.4 F (36.9 C)     General:  NAD Abdomen:  soft, gravid, EFW 7# Ex:  no edema SVE:  1/60/ballotable.   Foley balloon placed and filled with 60 mL of fluid Vulva:  At introitus/perineum extending from 5 to 7 o'clock tissue is thickened and hypopigmented FHTs:  120s, moderate variability, cateogry 1 Toco:  uterine irritability  Cephalic presentation confirmed by limited bedside US  A/P   36 y.o. G1P0 [redacted]w[redacted]d presents for elective induction of labor IOL:  s/p misoprostol x 2.  Foley balloon placed.  Will start low dose pitocin Vulvar lesion: desires biopsy when epidural placed GBS negative Anticipate SVD    Steward Sames GEFFEL Rainn Bullinger

## 2022-07-21 NOTE — Progress Notes (Signed)
TC from RN re: fetal heart rate deceleration following epidural placement.  There was a 3-4 minute deceleration to the 70s with return to baseline.  Phenylephrine was administered.  A second prolonged deceleration to the 70s occurred that did not resolve with multiple position changes.  Pitocin was stopped and terbutaline was administered with subsequent return to baseline.  Per RN, foley bulb was out and SVE 5/50/bbow.    Upon my arrival after administration of terbutaline, baseline was 150s with moderate variability and accelerations, no additional decelerations  Suspect decelerations due to relative hypotension following epidural.  Will restart pitocin now as it has been > 30 minutes since last deceleration Will evaluate for AROM in next couple of hours

## 2022-07-21 NOTE — Progress Notes (Signed)
Patient seen and examined--comfortable with epidural  Fetal heart rate tracing at time of exam had been category 1 since prolonged deceleration at 1330.   Patient placed on back for SVE--5/80/bulging bag  Just prior to AROM, slow fetal heart rate deceleration to the 90s recurred.  It required multiple position changes and discontinuation of the pitocin to resolve.  There was a return to baseline after 6 minutes with moderate variability and no additional decelerations  Will hold pitocin for an additional 30 minutes, then restart.  Will the consider AROM  Given three prolonged decelerations, did discuss in detail potential need for cesarean section that could be urgent in nature.  We discussed the risks to cesarean section to include infection, bleeding, damage to surrounding structures (including but not limited to bowel, bladder, tubes, ovaries, nerves, vessels, baby), need for blood transfusion, venous thromboembolism, need for additional procedures. Michelle Padilla and her family had time to ask all questions.

## 2022-07-21 NOTE — Anesthesia Preprocedure Evaluation (Signed)
Anesthesia Evaluation  Patient identified by MRN, date of birth, ID band Patient awake    Reviewed: Allergy & Precautions, H&P , NPO status , Patient's Chart, lab work & pertinent test results  Airway Mallampati: II   Neck ROM: full    Dental   Pulmonary neg pulmonary ROS,    breath sounds clear to auscultation       Cardiovascular negative cardio ROS   Rhythm:regular Rate:Normal     Neuro/Psych PSYCHIATRIC DISORDERS Anxiety    GI/Hepatic   Endo/Other    Renal/GU      Musculoskeletal   Abdominal   Peds  Hematology   Anesthesia Other Findings   Reproductive/Obstetrics (+) Pregnancy                             Anesthesia Physical Anesthesia Plan  ASA: 2  Anesthesia Plan: Epidural   Post-op Pain Management:    Induction: Intravenous  PONV Risk Score and Plan: 2 and Treatment may vary due to age or medical condition  Airway Management Planned: Natural Airway  Additional Equipment:   Intra-op Plan:   Post-operative Plan:   Informed Consent: I have reviewed the patients History and Physical, chart, labs and discussed the procedure including the risks, benefits and alternatives for the proposed anesthesia with the patient or authorized representative who has indicated his/her understanding and acceptance.     Dental advisory given  Plan Discussed with: Anesthesiologist  Anesthesia Plan Comments:         Anesthesia Quick Evaluation

## 2022-07-21 NOTE — Progress Notes (Signed)
No further decelerations since restarting pitocin  Toco: q2-3 minutes EFM: 130s, moderate variabiliy, category 1 SVE: 5/80/-2, AROM clear fluid  G1 @ [redacted]w[redacted]d with IOL for AMA Continue pitocin

## 2022-07-22 ENCOUNTER — Encounter (HOSPITAL_COMMUNITY): Payer: Self-pay | Admitting: Obstetrics and Gynecology

## 2022-07-22 LAB — CBC
HCT: 32.9 % — ABNORMAL LOW (ref 36.0–46.0)
Hemoglobin: 10.5 g/dL — ABNORMAL LOW (ref 12.0–15.0)
MCH: 23.1 pg — ABNORMAL LOW (ref 26.0–34.0)
MCHC: 31.9 g/dL (ref 30.0–36.0)
MCV: 72.5 fL — ABNORMAL LOW (ref 80.0–100.0)
Platelets: 151 10*3/uL (ref 150–400)
RBC: 4.54 MIL/uL (ref 3.87–5.11)
RDW: 14.9 % (ref 11.5–15.5)
WBC: 13.5 10*3/uL — ABNORMAL HIGH (ref 4.0–10.5)
nRBC: 0 % (ref 0.0–0.2)

## 2022-07-22 MED ORDER — TETANUS-DIPHTH-ACELL PERTUSSIS 5-2.5-18.5 LF-MCG/0.5 IM SUSY: 0.5000 mL | PREFILLED_SYRINGE | Freq: Once | INTRAMUSCULAR | Status: DC

## 2022-07-22 MED ORDER — OXYCODONE HCL 5 MG PO TABS: 10.0000 mg | ORAL_TABLET | ORAL | Status: DC | PRN

## 2022-07-22 MED ORDER — OXYCODONE HCL 5 MG PO TABS: 5.0000 mg | ORAL_TABLET | ORAL | Status: DC | PRN

## 2022-07-22 MED ORDER — COCONUT OIL OIL: 1.0000 | TOPICAL_OIL | Status: DC | PRN

## 2022-07-22 MED ORDER — ONDANSETRON HCL 4 MG PO TABS: 4.0000 mg | ORAL_TABLET | ORAL | Status: DC | PRN

## 2022-07-22 MED ORDER — WITCH HAZEL-GLYCERIN EX PADS: 1.0000 | MEDICATED_PAD | CUTANEOUS | Status: DC | PRN

## 2022-07-22 MED ORDER — SENNOSIDES-DOCUSATE SODIUM 8.6-50 MG PO TABS
2.0000 | ORAL_TABLET | ORAL | Status: DC
  Administered 2022-07-22 – 2022-07-23 (×2): 2 via ORAL
  Filled 2022-07-22 (×2): qty 2

## 2022-07-22 MED ORDER — DIPHENHYDRAMINE HCL 25 MG PO CAPS: 25.0000 mg | ORAL_CAPSULE | Freq: Four times a day (QID) | ORAL | Status: DC | PRN

## 2022-07-22 MED ORDER — SODIUM CHLORIDE 0.9 % IV SOLN: INTRAVENOUS | Status: DC | PRN

## 2022-07-22 MED ORDER — IBUPROFEN 600 MG PO TABS
600.0000 mg | ORAL_TABLET | Freq: Four times a day (QID) | ORAL | Status: DC
  Administered 2022-07-22 – 2022-07-23 (×4): 600 mg via ORAL
  Filled 2022-07-22 (×4): qty 1

## 2022-07-22 MED ORDER — DIBUCAINE (PERIANAL) 1 % EX OINT: 1.0000 | TOPICAL_OINTMENT | CUTANEOUS | Status: DC | PRN

## 2022-07-22 MED ORDER — PRENATAL MULTIVITAMIN CH
1.0000 | ORAL_TABLET | Freq: Every day | ORAL | Status: DC
  Administered 2022-07-22 – 2022-07-23 (×2): 1 via ORAL
  Filled 2022-07-22 (×2): qty 1

## 2022-07-22 MED ORDER — SIMETHICONE 80 MG PO CHEW: 80.0000 mg | CHEWABLE_TABLET | ORAL | Status: DC | PRN

## 2022-07-22 MED ORDER — ONDANSETRON HCL 4 MG/2ML IJ SOLN: 4.0000 mg | INTRAMUSCULAR | Status: DC | PRN

## 2022-07-22 MED ORDER — BENZOCAINE-MENTHOL 20-0.5 % EX AERO
1.0000 | INHALATION_SPRAY | CUTANEOUS | Status: DC | PRN
  Administered 2022-07-22: 1 via TOPICAL
  Filled 2022-07-22: qty 56

## 2022-07-22 MED ORDER — ACETAMINOPHEN 325 MG PO TABS: 650.0000 mg | ORAL_TABLET | ORAL | Status: DC | PRN

## 2022-07-22 NOTE — Plan of Care (Signed)
Spoke to Dr. Henderson Cloud concerning pt's Bps throughout the day. Dr. Henderson Cloud was informed of the levels throughout the day which was 142/85 at 0627; 146/88 at 0812; 141/90 at 1137 then 121/66 at 1433. I informed the provider that the patient continued to stated throughout the day that she was so tired after she took a nap we noticed her BP levels had dropped. Dr. Henderson Cloud stated to please notice a provider if the patient has 3 Bps in a row that is 140/90.

## 2022-07-22 NOTE — Progress Notes (Signed)
Patient is eating, ambulating, voiding.  Pain control is good.  Vitals:   07/22/22 0016 07/22/22 0100 07/22/22 0151 07/22/22 0627  BP: 107/63 112/64 108/74 (!) 142/85  Pulse: 73 71 (!) 59 (!) 41  Resp:  18 18 18   Temp:  99.2 F (37.3 C) 98.7 F (37.1 C) 98.2 F (36.8 C)  TempSrc:  Oral Oral Oral  SpO2:  100% 100%     Fundus firm Perineum without swelling.  Lab Results  Component Value Date   WBC 9.9 07/21/2022   HGB 11.5 (L) 07/21/2022   HCT 36.2 07/21/2022   MCV 72.3 (L) 07/21/2022   PLT 189 07/21/2022    --/--/O POS (08/23 0015)/RI  A/P Post partum day 1.  Routine care.  Expect d/c routine.    12-04-2003

## 2022-07-22 NOTE — Lactation Note (Signed)
This note was copied from a baby's chart. Lactation Consultation Note  Patient Name: Michelle Padilla Today's Date: 07/22/2022 Reason for consult: Initial assessment;Term;Primapara;1st time breastfeeding;Other (Comment) (Cleft lip) Age:37 hours   P1: Term infant at 39+2 weeks Feeding preference: Breast Cleft lip  RN requested latch assistance.  Birth parent was able to hand express 4 mls of colostrum which she fed to "[name]."  Reviewed breast feeding basics with parents and assessed suck on my gloved finger.  "[name]" has a tight suck, however, she did begin to relax with finger fed colostrum drops.  Assisted to latch but she was not interested in initiating a suck; "[name]" became fussy and pushed back off the breast.  Placed her STS and she lay quietly.  Provided breast shells and a manual pump to assist with nipple eversion.  Demonstrated the manual pump and #24 flange size is appropriate at this time.  Encouraged to pre-pump prior to every latching to help evert nipple for an easier latch.  Birth parent verbalized understanding.    Suggested to feed on cue or at least 8-12 times/24 hours.  Encouraged lots of STS, breast massage and hand expression.  Call for latch assistance as needed.  Would advise birth parent to begin pumping if "[name]" does not begin to latch even if colostrum drops are readily available.  Support person present.   Maternal Data Has patient been taught Hand Expression?: Yes Does the patient have breastfeeding experience prior to this delivery?: No  Feeding Mother's Current Feeding Choice: Breast Milk  LATCH Score Latch: Too sleepy or reluctant, no latch achieved, no sucking elicited.  Audible Swallowing: None  Type of Nipple: Everted at rest and after stimulation (Short shafted)  Comfort (Breast/Nipple): Soft / non-tender  Hold (Positioning): Assistance needed to correctly position infant at breast and maintain latch.  LATCH Score: 5   Lactation Tools  Discussed/Used Tools: Pump Breast pump type: Manual Pump Education: Setup, frequency, and cleaning Reason for Pumping: Nipple eversion Pumping frequency: Prior to latching  Interventions Interventions: Breast feeding basics reviewed;Assisted with latch;Skin to skin;Breast massage;Hand express;Pre-pump if needed;Breast compression;Hand pump;Shells;Expressed milk;Position options;Support pillows;Adjust position;Education;LC Services brochure  Discharge Pump: Hands Free  Consult Status Consult Status: Follow-up Date: 07/23/22 Follow-up type: In-patient    Dora Sims 07/22/2022, 6:57 AM

## 2022-07-22 NOTE — Discharge Summary (Addendum)
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 yo G1P1001 at 375w2das admitted to the hospital 07/21/2022 for induction of labor.  Indication for induction: Elective.  Patient had an uncomplicated labor course as follows: Membrane Rupture Time/Date: 5:47 PM ,07/21/2022   Delivery Method:Vaginal, Spontaneous  Episiotomy: None  Lacerations:  2nd degree  Details of delivery can be found in separate delivery note.  Patient had a routine postpartum course. Patient is discharged home 07/23/22.  Newborn Data: Birth date:07/21/2022  Birth time:10:18 PM  Gender:Female  Living status:Living  Apgars:8 ,9  Weight:3010 g   Magnesium Sulfate received: No BMZ received: No Rhophylac:No MMR:No T-DaP:Given prenatally Flu: No Transfusion:No  Physical exam  Vitals:   07/22/22 1433 07/22/22 1501 07/22/22 1939 07/23/22 0508  BP: 121/66 121/66 124/83 135/77  Pulse: 64 64 66 (!) 58  Resp: 18 18 16 17   Temp: (!) 97.4 F (36.3 C) 97.6 F (36.4 C) 98.5 F (36.9 C) 97.8 F (36.6 C)  TempSrc: Oral Oral Oral Oral  SpO2:  100%     Labs: Lab Results  Component Value Date   WBC 13.5 (H) 07/22/2022   HGB 10.5 (L) 07/22/2022   HCT 32.9 (L) 07/22/2022   MCV 72.5 (L) 07/22/2022    PLT 151 07/22/2022      Latest Ref Rng & Units 07/21/2022   12:15 AM  CMP  Glucose 70 - 99 mg/dL 92   BUN 6 - 20 mg/dL 13   Creatinine 0.44 - 1.00 mg/dL 0.80   Sodium 135 - 145 mmol/L 137   Potassium 3.5 - 5.1 mmol/L 4.2   Chloride 98 - 111 mmol/L 108   CO2 22 - 32 mmol/L 20   Calcium 8.9 - 10.3 mg/dL 9.3   Total Protein 6.5 - 8.1 g/dL 5.3   Total Bilirubin 0.3 - 1.2 mg/dL 0.2   Alkaline Phos 38 - 126 U/L 131   AST 15 - 41 U/L 19   ALT 0 - 44 U/L 10    Edinburgh Score:    07/22/2022    6:29 PM  Edinburgh Postnatal Depression Scale Screening Tool  I have been able to laugh and see the funny side of things. 0  I have looked forward with enjoyment to things. 0  I have blamed myself unnecessarily when things went wrong. 1  I have been anxious or worried for no good reason. 2  I have felt scared or panicky for no good reason. 2  Things have been getting on top of me. 1  I have been so unhappy that I have had difficulty sleeping. 0  I  have felt sad or miserable. 0  I have been so unhappy that I have been crying. 1  The thought of harming myself has occurred to me. 0  Edinburgh Postnatal Depression Scale Total 7      After visit meds:  Allergies as of 07/23/2022       Reactions   Latex Rash        Medication List     STOP taking these medications    aspirin EC 81 MG tablet       TAKE these medications    multivitamin-prenatal 27-0.8 MG Tabs tablet Take 1 tablet by mouth daily at 12 noon.   VITAMIN D PO Take by mouth.         Discharge home in stable condition Infant Feeding: Breast Infant Disposition:rooming in Discharge instruction: per After Visit Summary and Postpartum booklet. Activity: Advance as tolerated. Pelvic rest for 6 weeks.  Diet: routine diet Anticipated Birth Control: Unsure Postpartum Appointment:4 weeks Additional Postpartum F/U: Postpartum Depression checkup Future Appointments:No future appointments. Follow up Visit:   Follow-up Information     Jerelyn Charles, MD Follow up in 4 week(s).   Specialty: Obstetrics Contact information: [address] [place] [address] [phone]                     07/23/2022 Isaiah Serge, DO

## 2022-07-22 NOTE — Progress Notes (Signed)
Vitals:   07/22/22 0812 07/22/22 1137 07/22/22 1433 07/22/22 1501  BP: (!) 146/88 (!) 141/90 121/66 121/66  Pulse: (!) 49 (!) 54 64 64  Resp: 18 18 18 18   Temp: 97.8 F (36.6 C)  (!) 97.4 F (36.3 C) 97.6 F (36.4 C)  TempSrc: Oral  Oral Oral  SpO2: 100%   100%    Two increased BPs but now normal- will continue to watch.

## 2022-07-22 NOTE — Anesthesia Postprocedure Evaluation (Signed)
Anesthesia Post Note  Patient: Radio producer  Procedure(s) Performed: AN AD HOC LABOR EPIDURAL     Patient location during evaluation: Mother Baby Anesthesia Type: Epidural Level of consciousness: awake, oriented and awake and alert Pain management: pain level controlled Vital Signs Assessment: post-procedure vital signs reviewed and stable Respiratory status: spontaneous breathing, respiratory function stable and nonlabored ventilation Cardiovascular status: stable Postop Assessment: no headache, adequate PO intake, able to ambulate, patient able to bend at knees and no apparent nausea or vomiting Anesthetic complications: no   No notable events documented.  Last Vitals:  Vitals:   07/22/22 0627 07/22/22 0812  BP: (!) 142/85 (!) 146/88  Pulse: (!) 41 (!) 49  Resp: 18 18  Temp: 36.8 C 36.6 C  SpO2:  100%    Last Pain:  Vitals:   07/22/22 0814  TempSrc:   PainSc: 0-No pain   Pain Goal:                   Athen Riel

## 2022-07-23 NOTE — Progress Notes (Signed)
Post Partum Day 2 Subjective: no complaints, up ad lib, voiding, tolerating PO, + flatus, and lochia mild-moderate. She denies HA, blurry vision, CP or SOB. She has noted more PUPPs lesions - mostly on her breasts ; not itchy. Hydrocortisone cream helpful. She is bonding well with baby - baby to stay another night per peds.   Objective: Blood pressure 135/77, pulse (!) 58, temperature 97.8 F (36.6 C), temperature source Oral, resp. rate 17, last menstrual period 10/19/2021, SpO2 100 %, unknown if currently breastfeeding.  Physical Exam:  General: alert, cooperative, and no distress Lochia: appropriate Uterine Fundus: firm Incision: n/a DVT Evaluation: No evidence of DVT seen on physical exam. No significant calf/ankle edema.  Recent Labs    07/21/22 0015 07/22/22 0617  HGB 11.5* 10.5*  HCT 36.2 32.9*    Assessment/Plan: Discharge home and Breastfeeding Pt to room in Noted pt received 5 does of antibiotics ( ampicillin) ; afeb x 24hrs wbc decreasing BP stable overnight  F/U 4 weeks    LOS: 2 days   [place] Lapaglia W Obdulia Steier, DO 07/23/2022, 10:56 AM

## 2022-07-23 NOTE — Lactation Note (Signed)
This note was copied from a baby's chart. Lactation Consultation Note  Patient Name: Michelle Padilla [id number] Date: 07/23/2022 Reason for consult: Follow-up assessment;Mother's request;Difficult latch;Term;Breastfeeding assistance Age:37 hours  LC assisted with latching infant at the breast with signs of milk transfer. Birth parent struggling to latch on the right breast. LC assisted with nipple role to extend nipple before latching. LC did some suck training to bring tongue down. Infant can form tight seal on the finger.  With the latch, clicking noted at first but with change in position resolved. Birth parent denied any pain with the latch.  Infant still feeding at the end of the visit.   Maternal Data Has patient been taught Hand Expression?: Yes  Feeding Mother's Current Feeding Choice: Breast Milk and Formula Nipple Type: Dr. Lorne Skeens  LATCH Score Latch: Repeated attempts needed to sustain latch, nipple held in mouth throughout feeding, stimulation needed to elicit sucking reflex.  Audible Swallowing: Spontaneous and intermittent  Type of Nipple: Everted at rest and after stimulation  Comfort (Breast/Nipple): Soft / non-tender  Hold (Positioning): Assistance needed to correctly position infant at breast and maintain latch.  LATCH Score: 8   Lactation Tools Discussed/Used    Interventions Interventions: Breast feeding basics reviewed;Assisted with latch;Skin to skin;Breast massage;Hand express;Breast compression;Adjust position;Support pillows;Position options;Expressed milk;Education;Visual merchandiser education  Discharge    Consult Status Consult Status: Follow-up Date: 07/24/22 Follow-up type: In-patient    Michelle Hillis  Padilla 07/23/2022, 12:45 PM

## 2022-07-23 NOTE — Discharge Instructions (Signed)
Call office with any concerns [phone] 

## 2022-07-23 NOTE — Progress Notes (Signed)
CSW received consult for hx of Anxiety and Depression.  CSW met with MOB to offer support and complete assessment.    When CSW arrived, MOB was resting in bed, infant was asleep in bassinet, and FOB was resting on the couch.  CSW explained CSW role and the need to complete an assessment. MOB gave CSW permission to complete the assessment while FOB was present.  FOB appeared to be a support to MOB and engaged with CSW. MOB was polite, easy to engage, and receptive to meeting with CSW.   CSW asked about MOB's MH hx and MOB acknowledged being dx with anxiety while attending high school.  Per MOB, she is not currently taking any medication.  CSW provided education regarding the baby blues period vs. perinatal mood disorders, discussed treatment and gave resources for mental health follow up if concerns arise.  CSW recommends self-evaluation during the postpartum time period using the New Mom Checklist from Postpartum Progress and encouraged MOB to contact a medical professional if symptoms are noted at any time.  CSW presented with insight and awareness and did not demonstrate any acute MH symptoms. MOB shared having a good support team that consists of MOB's and FOB's parents.  MOB also communicated feeling comfortable seeking additional help if needed. When CSW assessed for safety MOB denied SI and HI.   Laurey Arrow, MSW, LCSW Clinical Social Work [phone]

## 2022-07-23 NOTE — Lactation Note (Signed)
This note was copied from a baby's chart. Lactation Consultation Note  Patient Name: Michelle Padilla Today's Date: 07/23/2022   Age:37 hours Per RN Zigmund Daniel), Birth Parent declined [facility] services tonight.  Maternal Data    Feeding    LATCH Score                    Lactation Tools Discussed/Used    Interventions    Discharge    Consult Status      Michelle Padilla 07/23/2022, 12:49 AM

## 2022-07-23 NOTE — Lactation Note (Signed)
This note was copied from a baby's chart. Lactation Consultation Note  Patient Name: Michelle Padilla Date: 07/23/2022 Reason for consult: Follow-up assessment Age:37 hours  P1, Baby sleeping skin to skin on mother's chest.  Mother admits she has slept very little and is tearful due to having to stay for antibiotics. Mother has recently hand expressed drops which she can give to baby when she wakes. Suggest mother rest and call when baby wakes for latch assistance.  She can put baby in crib or give baby to father of baby to hold.    Maternal Data Has patient been taught Hand Expression?: Yes Does the patient have breastfeeding experience prior to this delivery?: No  Feeding Mother's Current Feeding Choice: Breast Milk  Interventions Interventions: Breast feeding basics reviewed;Education  Consult Status Consult Status: Follow-up Date: 07/23/22 Follow-up type: In-patient    Dahlia Byes [facility] 07/23/2022, 8:31 AM

## 2022-07-24 ENCOUNTER — Ambulatory Visit: Payer: Self-pay

## 2022-07-24 NOTE — Lactation Note (Addendum)
This note was copied from a baby's chart. Lactation Consultation Note  Patient Name: Michelle Padilla KAJGO'T Date: 07/24/2022 Reason for consult: Follow-up assessment;1st time breastfeeding Age:37 hours  P1, Cleft lip. Assisted with latching baby on breast in cross cradle. Mother becoming independent.  Encouraged breast compression to achieve depth.  Infant was able to create good seal without evidence of fatigue.  No breaks in seal.  Intermittent swallows with no leaking milk.  Observed feeding for more than 15 min.   Reviewed paced feeding and milk storage. Encouraged mother to post pump 2-3 times per day and give milk back to baby at next feeding and/or in place of formula depending on volume pumped. Instructed parents to watch feeding quality.  If repeated short feedings 5-10 min, or difficulty latching and sustaining latch, suggest supplementation.   Monitor voids/stools.   Suggest lactation OP appt with [facility] or [place]. Reviewed engorgement care.  Maternal Data Has patient been taught Hand Expression?: Yes Does the patient have breastfeeding experience prior to this delivery?: No  Feeding Mother's Current Feeding Choice: Breast Milk and Formula  LATCH Score Latch: Grasps breast easily, tongue down, lips flanged, rhythmical sucking.  Audible Swallowing: A few with stimulation  Type of Nipple: Everted at rest and after stimulation  Comfort (Breast/Nipple): Soft / non-tender  Hold (Positioning): Assistance needed to correctly position infant at breast and maintain latch.  LATCH Score: 8   Lactation Tools Discussed/Used Breast pump type: Manual  Interventions Interventions: Breast feeding basics reviewed;Assisted with latch;Skin to skin;Hand express;Support pillows;Hand pump;Education  Discharge Discharge Education: Engorgement and breast care;Warning signs for feeding baby;Outpatient recommendation Pump: Hands Free;Personal Michelle Padilla)  Consult  Status Consult Status: Complete Date: 07/24/22    Dahlia Byes [facility] 07/24/2022, 12:16 PM

## 2022-07-26 DIAGNOSIS — N901 Moderate vulvar dysplasia: Secondary | ICD-10-CM | POA: Insufficient documentation

## 2022-07-26 LAB — SURGICAL PATHOLOGY

## 2022-07-29 ENCOUNTER — Telehealth (HOSPITAL_COMMUNITY): Payer: Self-pay | Admitting: *Deleted

## 2022-07-29 NOTE — Telephone Encounter (Signed)
Hospital Discharge Follow-Up Call:  Patient reports that physically she is healing well from her delivery.  EPDS today was 20 and patient endorses that she has a history of anxiety and that postpartum adjustments have exacerbated her symptoms. Question 10 was 0 and she denies any thoughts of harming herself or her baby.  She says that she has already reached out to her OB, Dr. Chestine Spore at [facility], and that Dr. Chestine Spore prescribed Zoloft, which patient has begun taking, and provided her with local mental health resources.  Patient says that baby is doing well.  They are seeing a Lactation Consultant next week for help with latch and patient's milk supply.  Baby is also being supplemented with formula at present to ensure adequate caloric intake.  She reports that baby sleeps in a bassinet.  Reviewed ABCs of Safe Sleep.

## 2022-08-05 ENCOUNTER — Telehealth: Payer: Self-pay | Admitting: *Deleted

## 2022-08-05 NOTE — Telephone Encounter (Signed)
Spoke with the patient regarding the referral to GYN oncology. Patient scheduled as new patient with Dr Alvester Morin on 10/9 at 11:15 am.  Patient given an arrival time of 10:45 am.  Explained to the patient the the doctor will perform a pelvic exam at this visit. Patient given the policy that no visitors under the 16 yrs are allowed in the Cancer Center. Patient given the address/phone number for the clinic and that the center offers free valet service.

## 2022-09-02 ENCOUNTER — Encounter: Payer: Self-pay | Admitting: Psychiatry

## 2022-09-06 ENCOUNTER — Inpatient Hospital Stay (HOSPITAL_BASED_OUTPATIENT_CLINIC_OR_DEPARTMENT_OTHER): Payer: No Typology Code available for payment source | Admitting: Gynecologic Oncology

## 2022-09-06 ENCOUNTER — Other Ambulatory Visit: Payer: Self-pay | Admitting: Gynecologic Oncology

## 2022-09-06 ENCOUNTER — Other Ambulatory Visit: Payer: Self-pay

## 2022-09-06 ENCOUNTER — Encounter: Payer: Self-pay | Admitting: Psychiatry

## 2022-09-06 ENCOUNTER — Inpatient Hospital Stay: Payer: No Typology Code available for payment source | Attending: Psychiatry | Admitting: Psychiatry

## 2022-09-06 VITALS — BP 146/98 | HR 60 | Temp 98.3°F | Resp 18 | Ht 63.78 in | Wt 147.2 lb

## 2022-09-06 DIAGNOSIS — D071 Carcinoma in situ of vulva: Secondary | ICD-10-CM | POA: Diagnosis present

## 2022-09-06 MED ORDER — SENNOSIDES-DOCUSATE SODIUM 8.6-50 MG PO TABS: 2.0000 | ORAL_TABLET | Freq: Every day | ORAL | 0 refills | Status: DC

## 2022-09-06 MED ORDER — HYDROCODONE-ACETAMINOPHEN 5-325 MG PO TABS: 1.0000 | ORAL_TABLET | ORAL | 0 refills | Status: DC | PRN

## 2022-09-06 NOTE — H&P (View-Only) (Signed)
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  We reviewed the nature of vulvar dysplasia. Surgical excision is the mainstay of treatment, but ablative therapy or pharmacologic treatment is an option for some patients in certain clinical scenarios. The goals of treatment of vulvar dysplasia are to prevent development of vulvar squamous carcinoma and relieve any associated symptoms, such as pain or itching. The goal is also preserve vulvar anatomy as best as possible.  Excision provides both treatment and a diagnostic specimen for those women with high-grade dysplasia. Invasive squamous cell carcinoma is present at the time of excision in 10-22% of women with VIN on initial biopsy.    She has elected to proceed with surgical excision.  Given the additional more broad areas of mild dysplasia on bilateral medial surfaces of the posterior labium minora, also discussed CO2 laser ablation of these additional areas.  Patient was consented for: Simple partial vulvectomy and CO2 laser ablation on 09/21/2022.  The risks of surgery were discussed in detail and she understands these to including but not limited to bleeding requiring a blood transfusion, infection, injury to adjacent organs (including but not limited to the bowels, bladder, ureters, nerves, blood vessels), wound separation, unforseen complication, and possible need for re-exploration.  If the patient experiences any of these events, she understands that her hospitalization or recovery may be prolonged and that she may need to take additional medications for a prolonged period. The patient will receive DVT and antibiotic prophylaxis as indicated. She voiced a clear understanding. She had the opportunity to ask questions and written informed consent was obtained today. She wishes to proceed.  She does  not require preoperative clearance. Her METs are >4.   A copy of this note was sent to the patient's referring provider.  Clide Cliff, MD Gynecologic Oncology   Medical Decision Making I personally spent  TOTAL 46 minutes face-to-face and non-face-to-face in the care of this patient, which includes all pre, intra, and post visit time on the date of service.  3 minutes spent reviewing records prior to the visit 30 Minutes in patient contact      3 minutes in other billable services 10 minutes charting , conferring with consultants etc.   ------------  CC: VIN 3  HISTORY OF PRESENT ILLNESS:  Michelle Padilla is a 36 y.o. woman who is seen in consultation at the request of Marlow Baars, MD for evaluation of VIN 3.  Patient reports that she was noted to have a lesion on the vulva about 1 year ago.  This area was biopsied at that time and returned as psoriasiform and spongiotic tissue reaction.  Patient was given a prescription for triamcinolone and reports that she use this twice a day for couple months.  When she became pregnant she discontinued use.  About 1 to 2 months prior to delivery she felt that she noticed an area on her vulva.  Plan was for biopsy at time of delivery which was completed on 07/21/2022 which returned as high-grade squamous intraepithelial lesion.  Today, patient presents alone.  She reports that she has overall been asymptomatic but was paying more close attention to her vulvar area following the prior biopsy.  She reports a possible episode of eczema on her face for a few months in 2021 which resolved with changing of her facial products including facial wash.  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   PAST MEDICAL HISTORY: Past Medical History:  Diagnosis Date   Anxiety     PAST SURGICAL HISTORY: Past Surgical History:  Procedure Laterality Date   WISDOM TOOTH EXTRACTION Bilateral     OB/GYN HISTORY: OB History  Gravida Para Term  Preterm AB Living  1 1 1     1   SAB IAB Ectopic Multiple Live Births        0 1    # Outcome Date GA Lbr Len/2nd Weight Sex Delivery Anes PTL Lv  1 Term 07/21/22 [redacted]w[redacted]d 02:49 / 01:42 6 lb 10.2 oz (3.01 kg) F Vag-Spont EPI, Local  LIV      Age at menarche: 52 Age at menopause: N/A Hx of HRT: N/A Hx of STI: No Last pap: 07/22/21 LSIL, neg HPV; Pt reports repeat LSIL, HPV neg last week with ob/gyn History of abnormal pap smears: as above  SCREENING STUDIES:  Last mammogram: N/A Last colonoscopy: N/A  MEDICATIONS:  Current Outpatient Medications:    Prenatal Vit-Fe Fumarate-FA (MULTIVITAMIN-PRENATAL) 27-0.8 MG TABS tablet, Take 1 tablet by mouth daily at 12 noon., Disp: , Rfl:    VITAMIN D PO, Take by mouth., Disp: , Rfl:    HYDROcodone-acetaminophen (NORCO/VICODIN) 5-325 MG tablet, Take 1 tablet by mouth every 4 (four) hours as needed for moderate pain or severe pain. For AFTER surgery only, do not take and drive, Disp: 15 tablet, Rfl: 0   senna-docusate (SENOKOT-S) 8.6-50 MG tablet, Take 2 tablets by mouth at bedtime. For AFTER surgery, do not take if having diarrhea, Disp: 30 tablet, Rfl: 0   sertraline (ZOLOFT) 50 MG tablet, Take by mouth., Disp: , Rfl:   ALLERGIES: Allergies  Allergen Reactions   Latex Rash    FAMILY HISTORY: Family History  Problem Relation Age of Onset   Hypertension Mother    Seizures Father    Diabetes Maternal Grandmother    Diabetes Maternal Grandfather    Breast cancer Maternal Great-grandmother    Colon cancer Neg Hx    Ovarian cancer Neg Hx    Endometrial cancer Neg Hx    Pancreatic cancer Neg Hx    Prostate cancer Neg Hx     SOCIAL HISTORY: Social History   Socioeconomic History   Marital status: Married    Spouse name: Not on file   Number of children: Not on file   Years of education: Not on file   Highest education level: Not on file  Occupational History   Not on file  Tobacco Use   Smoking status: Never   Smokeless tobacco:  Never  Vaping Use   Vaping Use: Never used  Substance and Sexual Activity   Alcohol use: Not Currently    Comment: Social   Drug use: Never   Sexual activity: Yes  Other Topics Concern   Not on file  Social History Narrative   Not on file   Social Determinants of Health   Financial Resource Strain: Not on file  Food Insecurity: Not on file  Transportation Needs: Not on file  Physical Activity: Not on file  Stress: Not on file  Social Connections: Not on file  Intimate Partner Violence: Not on file    REVIEW OF SYSTEMS: New patient intake form was reviewed.  Complete 10-system review is negative except for the following: Anxiety, resumption of menses  PHYSICAL EXAM: BP (!) 146/98 (BP Location: Right Arm, Patient Position: Sitting) Comment: informed Dr 03-07-1979 of  BP  Pulse 60   Temp 98.3 F (36.8 C) (Oral)   Resp 18   Ht 5' 3.78" (1.62 m)   Wt 147 lb 3.2 oz (66.8 kg)   LMP 10/19/2021 (Exact Date)   SpO2 100%   BMI 25.44 kg/m  Constitutional: No acute distress. Neuro/Psych: Alert, oriented.  Head and Neck: Normocephalic, atraumatic. Neck symmetric without masses. Sclera anicteric.  Respiratory: Normal work of breathing.  Abdomen:  Normoactive bowel sounds. Soft, non-distended, non-tender to palpation.  Extremities: Grossly normal range of motion. Warm, well perfused. No edema bilaterally. Skin: No rashes or lesions. Lymphatic: No inguinal adenopathy. Genitourinary: External genitalia small raised lesion at the posterior fourchette adjacent the vaginal opening.  Well-healed biopsy site.. Exam chaperoned by Joylene John, NP   VULVAR COLPOSCOPY PROCEDURE NOTE  Procedure Details: After appropriate verbal informed consent was obtained, a timeout was performed. Acetic acid was applied to the vulva with the findings as noted below. No additional biopsies were obtained. Water was used to rinse off the acetic acid. The patient tolerated the procedure well.   Adequate Exam:  yea  Biopsy Specimen: no  Condition: Stable. Patient tolerated procedure well.  Complications: None  Findings: Acetowhite changes of less than 1 cm raised lesion on posterior fourchette adjacent to vaginal opening.  Extends onto the anterior perineal body.  More mild acetowhite changes of posterior medial surface of the bilateral labia minora.  Colposcopic Impression: VIN3 and mild dysplasia   LABORATORY AND RADIOLOGIC DATA: Outside medical records were reviewed to synthesize the above history, along with the history and physical obtained during the visit.  Outside laboratory, patholog reports were reviewed, with pertinent results below.    WBC  Date Value Ref Range Status  07/22/2022 13.5 (H) 4.0 - 10.5 K/uL Final   Hemoglobin  Date Value Ref Range Status  07/22/2022 10.5 (L) 12.0 - 15.0 g/dL Final   HCT  Date Value Ref Range Status  07/22/2022 32.9 (L) 36.0 - 46.0 % Final   Platelets  Date Value Ref Range Status  07/22/2022 151 150 - 400 K/uL Final   Creatinine, Ser  Date Value Ref Range Status  07/21/2022 0.80 0.44 - 1.00 mg/dL Final   AST  Date Value Ref Range Status  07/21/2022 19 15 - 41 U/L Final   ALT  Date Value Ref Range Status  07/21/2022 10 0 - 44 U/L Final    Surgical path (07/21/22): FINAL MICROSCOPIC DIAGNOSIS:   A. 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

## 2022-09-06 NOTE — Progress Notes (Signed)
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  We reviewed the nature of vulvar dysplasia. Surgical excision is the mainstay of treatment, but ablative therapy or pharmacologic treatment is an option for some patients in certain clinical scenarios. The goals of treatment of vulvar dysplasia are to prevent development of vulvar squamous carcinoma and relieve any associated symptoms, such as pain or itching. The goal is also preserve vulvar anatomy as best as possible.  Excision provides both treatment and a diagnostic specimen for those women with high-grade dysplasia. Invasive squamous cell carcinoma is present at the time of excision in 10-22% of women with VIN on initial biopsy.    She has elected to proceed with surgical excision.  Given the additional more broad areas of mild dysplasia on bilateral medial surfaces of the posterior labium minora, also discussed CO2 laser ablation of these additional areas.  Patient was consented for: Simple partial vulvectomy and CO2 laser ablation on 09/21/2022.  The risks of surgery were discussed in detail and she understands these to including but not limited to bleeding requiring a blood transfusion, infection, injury to adjacent organs (including but not limited to the bowels, bladder, ureters, nerves, blood vessels), wound separation, unforseen complication, and possible need for re-exploration.  If the patient experiences any of these events, she understands that her hospitalization or recovery may be prolonged and that she may need to take additional medications for a prolonged period. The patient will receive DVT and antibiotic prophylaxis as indicated. She voiced a clear understanding. She had the opportunity to ask questions and written informed consent was obtained today. She wishes to proceed.  She does  not require preoperative clearance. Her METs are >4.   A copy of this note was sent to the patient's referring provider.  Caoimhe Damron, MD Gynecologic Oncology   Medical Decision Making I personally spent  TOTAL 46 minutes face-to-face and non-face-to-face in the care of this patient, which includes all pre, intra, and post visit time on the date of service.  3 minutes spent reviewing records prior to the visit 30 Minutes in patient contact      3 minutes in other billable services 10 minutes charting , conferring with consultants etc.   ------------  CC: VIN 3  HISTORY OF PRESENT ILLNESS:  Michelle Padilla is a 36 y.o. woman who is seen in consultation at the request of Dyanna Clark, MD for evaluation of VIN 3.  Patient reports that she was noted to have a lesion on the vulva about 1 year ago.  This area was biopsied at that time and returned as psoriasiform and spongiotic tissue reaction.  Patient was given a prescription for triamcinolone and reports that she use this twice a day for couple months.  When she became pregnant she discontinued use.  About 1 to 2 months prior to delivery she felt that she noticed an area on her vulva.  Plan was for biopsy at time of delivery which was completed on 07/21/2022 which returned as high-grade squamous intraepithelial lesion.  Today, patient presents alone.  She reports that she has overall been asymptomatic but was paying more close attention to her vulvar area following the prior biopsy.  She reports a possible episode of eczema on her face for a few months in 2021 which resolved with changing of her facial products including facial wash.  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   PAST MEDICAL HISTORY: Past Medical History:  Diagnosis Date   Anxiety     PAST SURGICAL HISTORY: Past Surgical History:  Procedure Laterality Date   WISDOM TOOTH EXTRACTION Bilateral     OB/GYN HISTORY: OB History  Gravida Para Term  Preterm AB Living  1 1 1     1  SAB IAB Ectopic Multiple Live Births        0 1    # Outcome Date GA Lbr Len/2nd Weight Sex Delivery Anes PTL Lv  1 Term 07/21/22 [redacted]w[redacted]d 02:49 / 01:42 6 lb 10.2 oz (3.01 kg) F Vag-Spont EPI, Local  LIV      Age at menarche: 13 Age at menopause: N/A Hx of HRT: N/A Hx of STI: No Last pap: 07/22/21 LSIL, neg HPV; Pt reports repeat LSIL, HPV neg last week with ob/gyn History of abnormal pap smears: as above  SCREENING STUDIES:  Last mammogram: N/A Last colonoscopy: N/A  MEDICATIONS:  Current Outpatient Medications:    Prenatal Vit-Fe Fumarate-FA (MULTIVITAMIN-PRENATAL) 27-0.8 MG TABS tablet, Take 1 tablet by mouth daily at 12 noon., Disp: , Rfl:    VITAMIN D PO, Take by mouth., Disp: , Rfl:    HYDROcodone-acetaminophen (NORCO/VICODIN) 5-325 MG tablet, Take 1 tablet by mouth every 4 (four) hours as needed for moderate pain or severe pain. For AFTER surgery only, do not take and drive, Disp: 15 tablet, Rfl: 0   senna-docusate (SENOKOT-S) 8.6-50 MG tablet, Take 2 tablets by mouth at bedtime. For AFTER surgery, do not take if having diarrhea, Disp: 30 tablet, Rfl: 0   sertraline (ZOLOFT) 50 MG tablet, Take by mouth., Disp: , Rfl:   ALLERGIES: Allergies  Allergen Reactions   Latex Rash    FAMILY HISTORY: Family History  Problem Relation Age of Onset   Hypertension Mother    Seizures Father    Diabetes Maternal Grandmother    Diabetes Maternal Grandfather    Breast cancer Maternal Great-grandmother    Colon cancer Neg Hx    Ovarian cancer Neg Hx    Endometrial cancer Neg Hx    Pancreatic cancer Neg Hx    Prostate cancer Neg Hx     SOCIAL HISTORY: Social History   Socioeconomic History   Marital status: Married    Spouse name: Not on file   Number of children: Not on file   Years of education: Not on file   Highest education level: Not on file  Occupational History   Not on file  Tobacco Use   Smoking status: Never   Smokeless tobacco:  Never  Vaping Use   Vaping Use: Never used  Substance and Sexual Activity   Alcohol use: Not Currently    Comment: Social   Drug use: Never   Sexual activity: Yes  Other Topics Concern   Not on file  Social History Narrative   Not on file   Social Determinants of Health   Financial Resource Strain: Not on file  Food Insecurity: Not on file  Transportation Needs: Not on file  Physical Activity: Not on file  Stress: Not on file  Social Connections: Not on file  Intimate Partner Violence: Not on file    REVIEW OF SYSTEMS: New patient intake form was reviewed.  Complete 10-system review is negative except for the following: Anxiety, resumption of menses  PHYSICAL EXAM: BP (!) 146/98 (BP Location: Right Arm, Patient Position: Sitting) Comment: informed Dr Orton Capell of   BP  Pulse 60   Temp 98.3 F (36.8 C) (Oral)   Resp 18   Ht 5' 3.78" (1.62 m)   Wt 147 lb 3.2 oz (66.8 kg)   LMP 10/19/2021 (Exact Date)   SpO2 100%   BMI 25.44 kg/m  Constitutional: No acute distress. Neuro/Psych: Alert, oriented.  Head and Neck: Normocephalic, atraumatic. Neck symmetric without masses. Sclera anicteric.  Respiratory: Normal work of breathing.  Abdomen:  Normoactive bowel sounds. Soft, non-distended, non-tender to palpation.  Extremities: Grossly normal range of motion. Warm, well perfused. No edema bilaterally. Skin: No rashes or lesions. Lymphatic: No inguinal adenopathy. Genitourinary: External genitalia small raised lesion at the posterior fourchette adjacent the vaginal opening.  Well-healed biopsy site.. Exam chaperoned by Joylene John, NP   VULVAR COLPOSCOPY PROCEDURE NOTE  Procedure Details: After appropriate verbal informed consent was obtained, a timeout was performed. Acetic acid was applied to the vulva with the findings as noted below. No additional biopsies were obtained. Water was used to rinse off the acetic acid. The patient tolerated the procedure well.   Adequate Exam:  yea  Biopsy Specimen: no  Condition: Stable. Patient tolerated procedure well.  Complications: None  Findings: Acetowhite changes of less than 1 cm raised lesion on posterior fourchette adjacent to vaginal opening.  Extends onto the anterior perineal body.  More mild acetowhite changes of posterior medial surface of the bilateral labia minora.  Colposcopic Impression: VIN3 and mild dysplasia   LABORATORY AND RADIOLOGIC DATA: Outside medical records were reviewed to synthesize the above history, along with the history and physical obtained during the visit.  Outside laboratory, patholog reports were reviewed, with pertinent results below.    WBC  Date Value Ref Range Status  07/22/2022 13.5 (H) 4.0 - 10.5 K/uL Final   Hemoglobin  Date Value Ref Range Status  07/22/2022 10.5 (L) 12.0 - 15.0 g/dL Final   HCT  Date Value Ref Range Status  07/22/2022 32.9 (L) 36.0 - 46.0 % Final   Platelets  Date Value Ref Range Status  07/22/2022 151 150 - 400 K/uL Final   Creatinine, Ser  Date Value Ref Range Status  07/21/2022 0.80 0.44 - 1.00 mg/dL Final   AST  Date Value Ref Range Status  07/21/2022 19 15 - 41 U/L Final   ALT  Date Value Ref Range Status  07/21/2022 10 0 - 44 U/L Final    Surgical path (07/21/22): FINAL MICROSCOPIC DIAGNOSIS:   A. 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

## 2022-09-06 NOTE — Patient Instructions (Addendum)
Preparing for your Surgery  Plan for surgery on September 21, 2022 with Dr. Bernadene Bell at [facility]. You will be scheduled for simple partial vulvectomy, laser application to the vulva.   Pre-operative Testing -You will receive a phone call from presurgical testing at [facility] to discuss surgery instructions and arrange for lab work if needed.  -Bring your insurance card, copy of an advanced directive if applicable, medication list.  -You should not be taking blood thinners or aspirin at least ten days prior to surgery unless instructed by your surgeon.  -Do not take supplements such as fish oil (omega 3), red yeast rice, turmeric before your surgery. You want to avoid medications with aspirin in them including headache powders such as BC or Goody's), Excedrin migraine.  Day Before Surgery at [place] will be advised you can have clear liquids up until 3 hours before your surgery.    Your role in recovery Your role is to become active as soon as directed by your doctor, while still giving yourself time to heal.  Rest when you feel tired. You will be asked to do the following in order to speed your recovery:  - Cough and breathe deeply. This helps to clear and expand your lungs and can prevent pneumonia after surgery.  - [place]. Do mild physical activity. Walking or moving your legs help your circulation and body functions return to normal. Do not try to get up or walk alone the first time after surgery.   -If you develop swelling on one leg or the other, pain in the back of your leg, redness/warmth in one of your legs, please call the office or go to the Emergency Room to have a doppler to rule out a blood clot. For shortness of breath, chest pain-seek care in the Emergency Room as soon as possible. - Actively manage your pain. Managing your pain lets you move in comfort. We will ask you to rate your pain on a scale of  zero to 10. It is your responsibility to tell your doctor or nurse where and how much you hurt so your pain can be treated.  Special Considerations -Your final pathology results from surgery should be available around one week after surgery and the results will be relayed to you when available.  -FMLA forms can be faxed to [phone] and please allow 5-7 business days for completion.  Pain Management After Surgery -You will be prescribed your pain medication and bowel regimen medications before surgery so that you can have these available when you are discharged from the hospital. The pain medication is for use ONLY AFTER surgery and a new prescription will not be given.   -Make sure that you have Tylenol and Ibuprofen at home IF [place] to use on a regular basis after surgery for pain control. We recommend alternating the medications every hour to six hours since they work differently and are processed in the body differently for pain relief.  -Review the attached handout on narcotic use and their risks and side effects.   Bowel Regimen -You will be prescribed Sennakot-S to take nightly to prevent constipation especially if you are taking the narcotic pain medication intermittently.  It is important to prevent constipation and drink adequate amounts of liquids. You can stop taking this medication when you are not taking pain medication and you are back on your normal bowel routine.  Risks  of Surgery Risks of surgery are low but include bleeding, infection, damage to surrounding structures, re-operation, blood clots, and very rarely death.  AFTER SURGERY INSTRUCTIONS  Return to work:  2-4 weeks if applicable based on occupation  We recommend purchasing several bags of frozen green peas and dividing them into ziploc bags. You will want to keep these in the freezer and have them ready to use as ice packs to the vulvar incision. Once the ice pack is no longer  cold, you can get another from the freezer. The frozen peas mold to your body better than a regular ice pack.   Activity: 1. Be up and out of the bed during the day.  Take a nap if needed.  You may walk up steps but be careful and use the hand rail.  Stair climbing will tire you more than you think, you may need to stop part way and rest.   2. No lifting or straining for 2 weeks minimum over 10 pounds. No pushing, pulling, straining for 2 weeks.  3. No driving for minimum 24 hours after surgery.  Do not drive if you are taking narcotic pain medicine and make sure that your reaction time has returned.   4. You can shower as soon as the next day after surgery. Shower daily. No tub baths or submerging your body in water until cleared by your surgeon. If you have the soap that was given to you by pre-surgical testing that was used before surgery, you do not need to use it afterwards because this can irritate your incisions.   5. No sexual activity and nothing in the vagina for 4 weeks.  6. You may experience vaginal spotting and discharge after surgery.  The spotting is normal but if you experience heavy bleeding, call our office.  7. Take Tylenol or ibuprofen first for pain if you are able to take these medications and only use narcotic pain medication for severe pain not relieved by the Tylenol or Ibuprofen.  Monitor your Tylenol intake to a max of 4,000 mg in a 24 hour period. You can alternate these medications after surgery.  Diet: 1. Low sodium Heart Healthy Diet is recommended but you are cleared to resume your normal (before surgery) diet after your procedure.  2. It is safe to use a laxative, such as Miralax or Colace, if you have difficulty moving your bowels. You have been prescribed Sennakot at bedtime every evening to keep bowel movements regular and to prevent constipation.    Wound Care: 1. Keep clean and dry.  Shower daily.  Reasons to call the Doctor: Fever - Oral temperature  greater than 100.4 degrees Fahrenheit Foul-smelling vaginal discharge Difficulty urinating Nausea and vomiting Increased pain at the site of the incision that is unrelieved with pain medicine. Difficulty breathing with or without chest pain New calf pain especially if only on one side Sudden, continuing increased vaginal bleeding with or without clots.   Contacts: For questions or concerns you should contact:  Dr. Clide Cliff at [phone]  Warner Mccreedy, NP at [phone]  After Hours: call [phone] and have the GYN Oncologist paged/contacted (after 5 pm or on the weekends).  Messages sent via mychart are for non-urgent matters and are not responded to after hours so for urgent needs, please call the after hours number.

## 2022-09-09 NOTE — Progress Notes (Signed)
Patient here for new patient consultation with Dr. Bernadene Bell and for a pre-operative discussion prior to her scheduled surgery on 09/21/2022. She is scheduled for partial vulvectomy, vulvar laser. See after visit summary for additional details.    Discussed post-op pain management in detail including the aspects of the enhanced recovery pathway.  Advised her that a new prescription would be sent in for hydrocodone/APAP and it is only to be used for after her upcoming surgery.  We discussed the use of tylenol post-op and to monitor for a maximum of 4,000 mg in a 24 hour period.  Also prescribed sennakot to be used after surgery and to hold if having loose stools.  Discussed bowel regimen in detail.     Discussed measures to take at home to prevent DVT including frequent mobility.  Reportable signs and symptoms of DVT discussed. Post-operative instructions discussed and expectations for after surgery. Incisional care discussed as well including reportable signs and symptoms including erythema, drainage, wound separation.     5 minutes spent with the patient.  Verbalizing understanding of material discussed. No needs or concerns voiced at the end of the visit.   Advised patient to call for any needs.  Advised that her post-operative medications had been prescribed and could be picked up at any time.    This appointment is included in the global surgical bundle as pre-operative teaching and has no charge.

## 2022-09-10 ENCOUNTER — Ambulatory Visit (INDEPENDENT_AMBULATORY_CARE_PROVIDER_SITE_OTHER): Payer: No Typology Code available for payment source | Admitting: Nurse Practitioner

## 2022-09-10 VITALS — BP 150/92 | HR 62 | Temp 98.2°F | Ht 63.0 in | Wt 150.0 lb

## 2022-09-10 DIAGNOSIS — R030 Elevated blood-pressure reading, without diagnosis of hypertension: Secondary | ICD-10-CM | POA: Diagnosis not present

## 2022-09-10 DIAGNOSIS — Z136 Encounter for screening for cardiovascular disorders: Secondary | ICD-10-CM | POA: Diagnosis not present

## 2022-09-10 DIAGNOSIS — Z131 Encounter for screening for diabetes mellitus: Secondary | ICD-10-CM | POA: Insufficient documentation

## 2022-09-10 DIAGNOSIS — Z13220 Encounter for screening for lipoid disorders: Secondary | ICD-10-CM

## 2022-09-10 LAB — LIPID PANEL
Cholesterol: 204 mg/dL — ABNORMAL HIGH (ref 0–200)
HDL: 76.6 mg/dL (ref >39.00)
LDL Cholesterol: 102 mg/dL — ABNORMAL HIGH (ref 0–99)
NonHDL: 127.25
Total CHOL/HDL Ratio: 3
Triglycerides: 124 mg/dL (ref 0.0–149.0)
VLDL: 24.8 mg/dL (ref 0.0–40.0)

## 2022-09-10 LAB — COMPREHENSIVE METABOLIC PANEL
ALT: 16 U/L (ref 0–35)
AST: 20 U/L (ref 0–37)
Albumin: 4.4 g/dL (ref 3.5–5.2)
Alkaline Phosphatase: 46 U/L (ref 39–117)
BUN: 16 mg/dL (ref 6–23)
CO2: 28 mEq/L (ref 19–32)
Calcium: 9.4 mg/dL (ref 8.4–10.5)
Chloride: 103 mEq/L (ref 96–112)
Creatinine, Ser: 1.13 mg/dL (ref 0.40–1.20)
GFR: 62.42 mL/min (ref >60.00)
Glucose, Bld: 86 mg/dL (ref 70–99)
Potassium: 3.9 mEq/L (ref 3.5–5.1)
Sodium: 139 mEq/L (ref 135–145)
Total Bilirubin: 0.3 mg/dL (ref 0.2–1.2)
Total Protein: 6.9 g/dL (ref 6.0–8.3)

## 2022-09-10 LAB — CBC
HCT: 33.2 % — ABNORMAL LOW (ref 36.0–46.0)
Hemoglobin: 11.1 g/dL — ABNORMAL LOW (ref 12.0–15.0)
MCHC: 33.4 g/dL (ref 30.0–36.0)
MCV: 74.2 fl — ABNORMAL LOW (ref 78.0–100.0)
Platelets: 176 10*3/uL (ref 150.0–400.0)
RBC: 4.48 Mil/uL (ref 3.87–5.11)
RDW: 19.4 % — ABNORMAL HIGH (ref 11.5–15.5)
WBC: 4.4 10*3/uL (ref 4.0–10.5)

## 2022-09-10 LAB — TSH: TSH: 1.31 u[IU]/mL (ref 0.35–5.50)

## 2022-09-10 LAB — T4, FREE: Free T4: 0.78 ng/dL (ref 0.60–1.60)

## 2022-09-10 LAB — HEMOGLOBIN A1C: Hgb A1c MFr Bld: 5.3 % (ref 4.6–6.5)

## 2022-09-10 LAB — T3, FREE: T3, Free: 3.6 pg/mL (ref 2.3–4.2)

## 2022-09-10 NOTE — Assessment & Plan Note (Signed)
New, no previous diagnosis of hypertension.  Blood pressure readings at home are lower than what they are in the office now.  She is experiencing postpartum anxiety and stress associated with upcoming surgery for her as well as surgery for her daughter.  Asymptomatic.  For now patient will continue to monitor blood pressure at home few times a week and keep a log of this.  Proper technique explained and encouraged.  We also discussed to call the office if she experiences chest pain, shortness of breath, blurry vision, exertional fatigue, headache between now and her next appointment.  We will also order labs today for further evaluation, further recommendations may be made based upon the results.

## 2022-09-10 NOTE — Assessment & Plan Note (Signed)
Labs ordered today, further recommendations may be made based upon these results. 

## 2022-09-10 NOTE — Progress Notes (Signed)
New Patient Office Visit  Subjective    Patient ID: Michelle Padilla, female    DOB: May 04, 1986  Age: 36 y.o. MRN: [id number]  CC:  Chief Complaint  Patient presents with   Establish Care    Want to talk about elevated BP And also experience postpartum stress which gotten better after being prescribed Zoloft     HPI Michelle Padilla presents to establish care Originally from [place], last PCP visit was there approximately 2 years ago.  She is 7 weeks postpartum, had a baby girl named Water quality scientist.  Not currently breast-feeding, has [place] IUD in place for contraception.    Reports having some postpartum anxiety denies suicidal ideation.  On sertraline which has resulted in improvement of her mood.    Scheduled to undergo partial vulvectomy in approximately 10 days for treatment of vulvar intraepithelial neoplasia grade 3.  Her daughter is also scheduled to undergo surgery in December for small cleft lip.  Concerned about elevated blood pressures.  Reports no personal history of hypertension, went to her postpartum visit with OB/GYN blood pressure was normal at that time.  When she went to her visit with the [place] provider her blood pressure was slightly elevated at 146/98, she started checking it at home.  Has seen blood pressure in 130s over 90s as well as 120s over 80s.  Not experiencing any symptoms associated with hypertension such as blurred vision, double vision, chest pain, shortness of breath, exertional fatigue, etc.  Has a brother with hypertension diagnosis, denies any family history of early onset heart attack or stroke.  Outpatient Encounter Medications as of 09/10/2022  Medication Sig   HYDROcodone-acetaminophen (NORCO/VICODIN) 5-325 MG tablet Take 1 tablet by mouth every 4 (four) hours as needed for moderate pain or severe pain. For AFTER surgery only, do not take and drive   levonorgestrel (KYLEENA) 19.5 MG IUD 1 each by Intrauterine route once.   Prenatal Vit-Fe  Fumarate-FA (MULTIVITAMIN-PRENATAL) 27-0.8 MG TABS tablet Take 1 tablet by mouth daily at 12 noon.   senna-docusate (SENOKOT-S) 8.6-50 MG tablet Take 2 tablets by mouth at bedtime. For AFTER surgery, do not take if having diarrhea   sertraline (ZOLOFT) 50 MG tablet Take by mouth.   VITAMIN D PO Take by mouth.   No facility-administered encounter medications on file as of 09/10/2022.    Past Medical History:  Diagnosis Date   Anxiety     Past Surgical History:  Procedure Laterality Date   WISDOM TOOTH EXTRACTION Bilateral     Family History  Problem Relation Age of Onset   Hypertension Mother    Seizures Father    Diabetes Maternal Grandmother    Diabetes Maternal Grandfather    Breast cancer Maternal Great-grandmother    Colon cancer Neg Hx    Ovarian cancer Neg Hx    Endometrial cancer Neg Hx    Pancreatic cancer Neg Hx    Prostate cancer Neg Hx     Social History   Socioeconomic History   Marital status: Married    Spouse name: Not on file   Number of children: Not on file   Years of education: Not on file   Highest education level: Not on file  Occupational History   Not on file  Tobacco Use   Smoking status: Never   Smokeless tobacco: Never  Vaping Use   Vaping Use: Never used  Substance and Sexual Activity   Alcohol use: Not Currently    Comment: Social  Drug use: Never   Sexual activity: Yes  Other Topics Concern   Not on file  Social History Narrative   Not on file   Social Determinants of Health   Financial Resource Strain: Not on file  Food Insecurity: Not on file  Transportation Needs: Not on file  Physical Activity: Not on file  Stress: Not on file  Social Connections: Not on file  Intimate Partner Violence: Not on file    Review of Systems  Constitutional:  Negative for malaise/fatigue (no exertional fatigue).  Eyes:  Negative for blurred vision and double vision.  Respiratory:  Negative for shortness of breath.   Cardiovascular:   Negative for chest pain and palpitations.  Neurological:  Positive for headaches (had a h/a earlier this week).        Objective    BP (!) 150/92   Pulse 62   Temp 98.2 F (36.8 C) (Oral)   Ht 5\' 3"  (1.6 m)   Wt 150 lb (68 kg)   LMP 10/19/2021 (Exact Date) Comment: about seven weeks ago  SpO2 99%   Breastfeeding No   BMI 26.57 kg/m   Physical Exam Vitals reviewed.  Constitutional:      General: She is not in acute distress.    Appearance: Normal appearance.  HENT:     Head: Normocephalic and atraumatic.  Neck:     Vascular: No carotid bruit.  Cardiovascular:     Rate and Rhythm: Normal rate and regular rhythm.     Pulses: Normal pulses.     Heart sounds: Normal heart sounds. No murmur heard. Pulmonary:     Effort: Pulmonary effort is normal.     Breath sounds: Normal breath sounds.  Skin:    General: Skin is warm and dry.  Neurological:     General: No focal deficit present.     Mental Status: She is alert and oriented to person, place, and time.  Psychiatric:        Mood and Affect: Mood normal.        Behavior: Behavior normal.        Judgment: Judgment normal.         Assessment & Plan:   Problem List Items Addressed This Visit       Other   Elevated blood pressure reading - Primary    New, no previous diagnosis of hypertension.  Blood pressure readings at home are lower than what they are in the office now.  She is experiencing postpartum anxiety and stress associated with upcoming surgery for her as well as surgery for her daughter.  Asymptomatic.  For now patient will continue to monitor blood pressure at home few times a week and keep a log of this.  Proper technique explained and encouraged.  We also discussed to call the office if she experiences chest pain, shortness of breath, blurry vision, exertional fatigue, headache between now and her next appointment.  We will also order labs today for further evaluation, further recommendations may be made  based upon the results.      Relevant Orders   TSH   Comprehensive metabolic panel   CBC   T3, free   T4, free   Screening for diabetes mellitus    Labs ordered today, further recommendations may be made based upon these results.      Relevant Orders   Hemoglobin A1c   Encounter for lipid screening for cardiovascular disease    Labs ordered today, further recommendations may be made  based upon these results.      Relevant Orders   Lipid panel    Return in about 4 weeks (around 10/08/2022) for F/U with Nikie Cid.   Ailene Ards, NP

## 2022-09-13 ENCOUNTER — Encounter (HOSPITAL_BASED_OUTPATIENT_CLINIC_OR_DEPARTMENT_OTHER): Payer: Self-pay | Admitting: Psychiatry

## 2022-09-13 NOTE — Progress Notes (Signed)
Spoke w/ via phone for pre-op interview--- [employer]---- UPT              Lab results------ COVID test -----patient states asymptomatic no test needed Arrive at -------1215 NPO after MN NO Solid Food.  Clear liquids from MN until---1115 Med rec completed Medications to take morning of surgery -----Zoloft Diabetic medication ----- Patient instructed no nail polish to be worn day of surgery Patient instructed to bring photo id and insurance card day of surgery Patient aware to have Driver (ride ) / caregiver [employer]   for 24 hours after surgery  Patient Special Instructions ----- Pre-Op special Istructions ----- Patient verbalized understanding of instructions that were given at this phone interview. Patient denies shortness of breath, chest pain, fever, cough at this phone interview.

## 2022-09-20 ENCOUNTER — Encounter: Payer: Self-pay | Admitting: Psychiatry

## 2022-09-20 ENCOUNTER — Telehealth: Payer: Self-pay | Admitting: Surgery

## 2022-09-20 NOTE — Telephone Encounter (Signed)
Telephone call to check on pre-operative status.  Patient compliant with pre-operative instructions.  Reinforced nothing to eat after midnight. Clear liquids until 11 am. Patient to arrive at 12 pm.  No questions or concerns voiced.  Instructed to call for any needs.  

## 2022-09-21 ENCOUNTER — Other Ambulatory Visit: Payer: Self-pay

## 2022-09-21 ENCOUNTER — Encounter (HOSPITAL_BASED_OUTPATIENT_CLINIC_OR_DEPARTMENT_OTHER): Payer: Self-pay | Admitting: Psychiatry

## 2022-09-21 ENCOUNTER — Encounter (HOSPITAL_BASED_OUTPATIENT_CLINIC_OR_DEPARTMENT_OTHER)
Admission: RE | Disposition: A | Discharge status: Home / Self Care | Payer: Self-pay | Source: Home / Self Care | Attending: Psychiatry

## 2022-09-21 ENCOUNTER — Ambulatory Visit (HOSPITAL_BASED_OUTPATIENT_CLINIC_OR_DEPARTMENT_OTHER): Payer: No Typology Code available for payment source | Admitting: Anesthesiology

## 2022-09-21 ENCOUNTER — Ambulatory Visit (HOSPITAL_BASED_OUTPATIENT_CLINIC_OR_DEPARTMENT_OTHER)
Admission: RE | Admit: 2022-09-21 | Discharge: 2022-09-21 | Disposition: A | Discharge status: Home / Self Care | Payer: No Typology Code available for payment source | Attending: Psychiatry | Admitting: Psychiatry

## 2022-09-21 DIAGNOSIS — D071 Carcinoma in situ of vulva: Secondary | ICD-10-CM

## 2022-09-21 DIAGNOSIS — N901 Moderate vulvar dysplasia: Secondary | ICD-10-CM | POA: Insufficient documentation

## 2022-09-21 HISTORY — PX: CO2 LASER APPLICATION: SHX5778

## 2022-09-21 HISTORY — PX: VULVECTOMY PARTIAL: SHX6187

## 2022-09-21 LAB — POCT PREGNANCY, URINE: Preg Test, Ur: NEGATIVE

## 2022-09-21 SURGERY — VULVECTOMY, PARTIAL
Anesthesia: General | Site: Vulva

## 2022-09-21 MED ORDER — SILVER SULFADIAZINE 1 % EX CREA
TOPICAL_CREAM | CUTANEOUS | Status: DC | PRN
  Administered 2022-09-21: 1 via TOPICAL

## 2022-09-21 MED ORDER — DEXAMETHASONE SODIUM PHOSPHATE 10 MG/ML IJ SOLN
INTRAMUSCULAR | Status: AC
  Filled 2022-09-21: qty 1

## 2022-09-21 MED ORDER — DROPERIDOL 2.5 MG/ML IJ SOLN
INTRAMUSCULAR | Status: DC | PRN
  Administered 2022-09-21: .625 mg via INTRAVENOUS

## 2022-09-21 MED ORDER — LIDOCAINE HCL 1 % IJ SOLN
INTRAMUSCULAR | Status: DC | PRN
  Administered 2022-09-21: 12 mL

## 2022-09-21 MED ORDER — KETOROLAC TROMETHAMINE 15 MG/ML IJ SOLN: 15.0000 mg | INTRAMUSCULAR | Status: DC

## 2022-09-21 MED ORDER — PROPOFOL 10 MG/ML IV BOLUS
INTRAVENOUS | Status: AC
  Filled 2022-09-21: qty 20

## 2022-09-21 MED ORDER — MIDAZOLAM HCL 2 MG/2ML IJ SOLN
INTRAMUSCULAR | Status: AC
  Filled 2022-09-21: qty 2

## 2022-09-21 MED ORDER — ONDANSETRON HCL 4 MG/2ML IJ SOLN
INTRAMUSCULAR | Status: DC | PRN
  Administered 2022-09-21: 4 mg via INTRAVENOUS

## 2022-09-21 MED ORDER — ACETIC ACID 5 % SOLN
Status: DC | PRN
  Administered 2022-09-21: 1 via TOPICAL

## 2022-09-21 MED ORDER — ONDANSETRON HCL 4 MG/2ML IJ SOLN: 4.0000 mg | Freq: Once | INTRAMUSCULAR | Status: DC | PRN

## 2022-09-21 MED ORDER — LIDOCAINE HCL 1 % IJ SOLN
INTRAMUSCULAR | Status: AC
  Filled 2022-09-21: qty 20

## 2022-09-21 MED ORDER — GABAPENTIN 300 MG PO CAPS: 300.0000 mg | ORAL_CAPSULE | ORAL | Status: DC

## 2022-09-21 MED ORDER — SILVER NITRATE-POT NITRATE 75-25 % EX MISC
CUTANEOUS | Status: AC
  Filled 2022-09-21: qty 10

## 2022-09-21 MED ORDER — FENTANYL CITRATE (PF) 100 MCG/2ML IJ SOLN: 25.0000 ug | INTRAMUSCULAR | Status: DC | PRN

## 2022-09-21 MED ORDER — SCOPOLAMINE 1 MG/3DAYS TD PT72
MEDICATED_PATCH | TRANSDERMAL | Status: AC
  Filled 2022-09-21: qty 1

## 2022-09-21 MED ORDER — ACETAMINOPHEN 500 MG PO TABS
ORAL_TABLET | ORAL | Status: AC
  Filled 2022-09-21: qty 2

## 2022-09-21 MED ORDER — PHENYLEPHRINE 80 MCG/ML (10ML) SYRINGE FOR IV PUSH (FOR BLOOD PRESSURE SUPPORT)
PREFILLED_SYRINGE | INTRAVENOUS | Status: AC
  Filled 2022-09-21: qty 10

## 2022-09-21 MED ORDER — SCOPOLAMINE 1 MG/3DAYS TD PT72
1.0000 | MEDICATED_PATCH | TRANSDERMAL | Status: DC
  Administered 2022-09-21: 1.5 mg via TRANSDERMAL

## 2022-09-21 MED ORDER — LACTATED RINGERS IV SOLN: INTRAVENOUS | Status: DC

## 2022-09-21 MED ORDER — KETOROLAC TROMETHAMINE 30 MG/ML IJ SOLN: 30.0000 mg | Freq: Once | INTRAMUSCULAR | Status: DC | PRN

## 2022-09-21 MED ORDER — PHENYLEPHRINE 80 MCG/ML (10ML) SYRINGE FOR IV PUSH (FOR BLOOD PRESSURE SUPPORT)
PREFILLED_SYRINGE | INTRAVENOUS | Status: DC | PRN
  Administered 2022-09-21: 80 ug via INTRAVENOUS

## 2022-09-21 MED ORDER — STERILE WATER FOR IRRIGATION IR SOLN
Status: DC | PRN
  Administered 2022-09-21: 500 mL

## 2022-09-21 MED ORDER — ONDANSETRON HCL 4 MG/2ML IJ SOLN
INTRAMUSCULAR | Status: AC
  Filled 2022-09-21: qty 2

## 2022-09-21 MED ORDER — DEXAMETHASONE SODIUM PHOSPHATE 10 MG/ML IJ SOLN
INTRAMUSCULAR | Status: DC | PRN
  Administered 2022-09-21: 10 mg via INTRAVENOUS

## 2022-09-21 MED ORDER — OXYCODONE HCL 5 MG PO TABS: 5.0000 mg | ORAL_TABLET | Freq: Once | ORAL | Status: DC | PRN

## 2022-09-21 MED ORDER — PROPOFOL 10 MG/ML IV BOLUS
INTRAVENOUS | Status: DC | PRN
  Administered 2022-09-21: 20 mg via INTRAVENOUS
  Administered 2022-09-21: 200 mg via INTRAVENOUS
  Administered 2022-09-21: 20 mg via INTRAVENOUS

## 2022-09-21 MED ORDER — LIDOCAINE HCL (PF) 2 % IJ SOLN
INTRAMUSCULAR | Status: AC
  Filled 2022-09-21: qty 5

## 2022-09-21 MED ORDER — MIDAZOLAM HCL 5 MG/5ML IJ SOLN
INTRAMUSCULAR | Status: DC | PRN
  Administered 2022-09-21: 2 mg via INTRAVENOUS

## 2022-09-21 MED ORDER — ACETAMINOPHEN 500 MG PO TABS
1000.0000 mg | ORAL_TABLET | ORAL | Status: AC
  Administered 2022-09-21: 1000 mg via ORAL

## 2022-09-21 MED ORDER — LIDOCAINE 2% (20 MG/ML) 5 ML SYRINGE
INTRAMUSCULAR | Status: DC | PRN
  Administered 2022-09-21: 100 mg via INTRAVENOUS

## 2022-09-21 MED ORDER — FENTANYL CITRATE (PF) 100 MCG/2ML IJ SOLN
INTRAMUSCULAR | Status: AC
  Filled 2022-09-21: qty 2

## 2022-09-21 MED ORDER — EPHEDRINE SULFATE-NACL 50-0.9 MG/10ML-% IV SOSY
PREFILLED_SYRINGE | INTRAVENOUS | Status: DC | PRN
  Administered 2022-09-21: 5 mg via INTRAVENOUS
  Administered 2022-09-21: 10 mg via INTRAVENOUS
  Administered 2022-09-21 (×2): 5 mg via INTRAVENOUS

## 2022-09-21 MED ORDER — OXYCODONE HCL 5 MG/5ML PO SOLN: 5.0000 mg | Freq: Once | ORAL | Status: DC | PRN

## 2022-09-21 MED ORDER — GABAPENTIN 300 MG PO CAPS
ORAL_CAPSULE | ORAL | Status: AC
  Filled 2022-09-21: qty 1

## 2022-09-21 MED ORDER — EPHEDRINE 5 MG/ML INJ
INTRAVENOUS | Status: AC
  Filled 2022-09-21: qty 5

## 2022-09-21 MED ORDER — FERRIC SUBSULFATE 259 MG/GM EX SOLN
CUTANEOUS | Status: AC
  Filled 2022-09-21: qty 8

## 2022-09-21 MED ORDER — DEXAMETHASONE SODIUM PHOSPHATE 10 MG/ML IJ SOLN: 4.0000 mg | INTRAMUSCULAR | Status: DC

## 2022-09-21 MED ORDER — FENTANYL CITRATE (PF) 100 MCG/2ML IJ SOLN
INTRAMUSCULAR | Status: DC | PRN
  Administered 2022-09-21 (×3): 50 ug via INTRAVENOUS

## 2022-09-21 MED ORDER — ACETIC ACID 5 % SOLN
Status: AC
  Filled 2022-09-21: qty 50

## 2022-09-21 SURGICAL SUPPLY — 27 items
BLADE CLIPPER SENSICLIP SURGIC (BLADE) IMPLANT
BLADE SURG 15 STRL LF DISP TIS (BLADE) ×1 IMPLANT
BLADE SURG 15 STRL SS (BLADE) ×1
CATH ROBINSON RED A/P 16FR (CATHETERS) ×1 IMPLANT
CATH SILICONE 16FRX5CC (CATHETERS) IMPLANT
GAUZE 4X4 16PLY ~~LOC~~+RFID DBL (SPONGE) IMPLANT
GLOVE BIO SURGEON STRL SZ 6 (GLOVE) ×2 IMPLANT
GOWN STRL REUS W/TWL LRG LVL3 (GOWN DISPOSABLE) ×1 IMPLANT
KIT TURNOVER CYSTO (KITS) ×1 IMPLANT
NDL HYPO 25X1 1.5 SAFETY (NEEDLE) ×1 IMPLANT
NEEDLE HYPO 25X1 1.5 SAFETY (NEEDLE) ×1 IMPLANT
NS IRRIG 500ML POUR BTL (IV SOLUTION) ×1 IMPLANT
PACK PERINEAL COLD (PAD) ×1 IMPLANT
PACK VAGINAL WOMENS (CUSTOM PROCEDURE TRAY) ×1 IMPLANT
PUNCH BIOPSY DERMAL 3 (INSTRUMENTS) IMPLANT
PUNCH BIOPSY DERMAL 3MM (INSTRUMENTS)
PUNCH BIOPSY DERMAL 4MM (INSTRUMENTS) IMPLANT
SUT VIC AB 0 SH 27 (SUTURE) ×1 IMPLANT
SUT VIC AB 2-0 CT2 27 (SUTURE) IMPLANT
SUT VIC AB 2-0 SH 27 (SUTURE)
SUT VIC AB 2-0 SH 27XBRD (SUTURE) IMPLANT
SUT VIC AB 3-0 SH 27 (SUTURE) ×1
SUT VIC AB 3-0 SH 27X BRD (SUTURE) ×1 IMPLANT
SUT VIC AB 4-0 PS2 18 (SUTURE) ×1 IMPLANT
SWAB OB GYN 8IN STERILE 2PK (MISCELLANEOUS) IMPLANT
SYR BULB IRRIG 60ML STRL (SYRINGE) ×1 IMPLANT
TOWEL OR 17X26 10 PK STRL BLUE (TOWEL DISPOSABLE) ×1 IMPLANT

## 2022-09-21 NOTE — Discharge Instructions (Addendum)
AFTER SURGERY INSTRUCTIONS   Return to work:  2-4 weeks if applicable based on occupation   We recommend purchasing several bags of frozen green peas and dividing them into ziploc bags. You will want to keep these in the freezer and have them ready to use as ice packs to the vulvar incision. Once the ice pack is no longer cold, you can get another from the freezer. The frozen peas mold to your body better than a regular ice pack.    Activity: 1. Be up and out of the bed during the day.  Take a nap if needed.  You may walk up steps but be careful and use the hand rail.  Stair climbing will tire you more than you think, you may need to stop part way and rest.    2. No lifting or straining for 2 weeks minimum over 10 pounds. No pushing, pulling, straining for 2 weeks.   3. No driving for minimum 24 hours after surgery.  Do not drive if you are taking narcotic pain medicine and make sure that your reaction time has returned.    4. You can shower as soon as the next day after surgery. Shower daily. No tub baths or submerging your body in water until cleared by your surgeon. If you have the soap that was given to you by pre-surgical testing that was used before surgery, you do not need to use it afterwards because this can irritate your incisions.    5. No sexual activity and nothing in the vagina for 4 weeks.   6. You may experience vaginal spotting and discharge after surgery.  The spotting is normal but if you experience heavy bleeding, call our office.   7. Take Tylenol or ibuprofen first for pain if you are able to take these medications and only use narcotic pain medication for severe pain not relieved by the Tylenol or Ibuprofen.  Monitor your Tylenol intake to a max of 4,000 mg in a 24 hour period. You can alternate these medications after surgery.   Diet: 1. Low sodium Heart Healthy Diet is recommended but you are cleared to resume your normal (before surgery) diet after your procedure.    2. It is safe to use a laxative, such as Miralax or Colace, if you have difficulty moving your bowels. You have been prescribed Sennakot at bedtime every evening to keep bowel movements regular and to prevent constipation.     Wound Care: 1. Keep clean and dry.  Shower daily.   Reasons to call the Doctor: Fever - Oral temperature greater than 100.4 degrees Fahrenheit Foul-smelling vaginal discharge Difficulty urinating Nausea and vomiting Increased pain at the site of the incision that is unrelieved with pain medicine. Difficulty breathing with or without chest pain New calf pain especially if only on one side Sudden, continuing increased vaginal bleeding with or without clots.   Contacts: For questions or concerns you should contact:   Dr. Clide Cliff at [phone]   Warner Mccreedy, NP at [phone]   After Hours: call [phone] and have the GYN Oncologist paged/contacted (after 5 pm or on the weekends).   Messages sent via mychart are for non-urgent matters and are not responded to after hours so for urgent needs, please call the after hours number.   Post Anesthesia Home Care Instructions  Activity: Get plenty of rest for the remainder of the day. A responsible adult should stay with you for 24 hours following the procedure.  For the next  24 hours, DO NOT: -Drive a car -Paediatric nurse -Drink alcoholic beverages -Take any medication unless instructed by your physician -Make any legal decisions or sign important papers.  Meals: Start with liquid foods such as gelatin or soup. Progress to regular foods as tolerated. Avoid greasy, spicy, heavy foods. If nausea and/or vomiting occur, drink only clear liquids until the nausea and/or vomiting subsides. Call your physician if vomiting continues.  Special Instructions/Symptoms: Your throat may feel dry or sore from the anesthesia or the breathing tube placed in your throat during surgery. If this causes discomfort,  gargle with warm salt water. The discomfort should disappear within 24 hours.  If you had a scopolamine patch placed behind your ear for the management of post- operative nausea and/or vomiting:  1. The medication in the patch is effective for 72 hours, after which it should be removed.  Wrap patch in a tissue and discard in the trash. Wash hands thoroughly with soap and water. 2. You may remove the patch earlier than 72 hours if you experience unpleasant side effects which may include dry mouth, dizziness or visual disturbances. 3. Avoid touching the patch. Wash your hands with soap and water after contact with the patch.

## 2022-09-21 NOTE — Transfer of Care (Signed)
Immediate Anesthesia Transfer of Care Note  Patient: Michelle Padilla  Procedure(s) Performed: VULVECTOMY PARTIAL (Vulva) CO2 LASER APPLICATION TO VULVA (Vulva)  Patient Location: PACU  Anesthesia Type:General  Level of Consciousness: awake, alert , oriented and patient cooperative  Airway & Oxygen Therapy: Patient Spontanous Breathing  Post-op Assessment: Report given to RN and Post -op Vital signs reviewed and stable  Post vital signs: Reviewed and stable  Last Vitals:  Vitals Value Taken Time  BP 130/80 09/21/22 1651  Temp 36.6 C 09/21/22 1651  Pulse 103 09/21/22 1653  Resp 16 09/21/22 1653  SpO2 100 % 09/21/22 1653  Vitals shown include unvalidated device data.  Last Pain:  Vitals:   09/21/22 1232  TempSrc: Oral  PainSc: 0-No pain      Patients Stated Pain Goal: 6 (16/10/96 0454)  Complications: No notable events documented.

## 2022-09-21 NOTE — Anesthesia Preprocedure Evaluation (Signed)
Anesthesia Evaluation  Patient identified by MRN, date of birth, ID band Patient awake    Reviewed: Allergy & Precautions, NPO status , Patient's Chart, lab work & pertinent test results  Airway Mallampati: II  TM Distance: >3 FB Neck ROM: Full    Dental no notable dental hx.    Pulmonary neg pulmonary ROS,    Pulmonary exam normal breath sounds clear to auscultation       Cardiovascular negative cardio ROS Normal cardiovascular exam Rhythm:Regular Rate:Normal     Neuro/Psych Anxiety negative neurological ROS     GI/Hepatic negative GI ROS, Neg liver ROS,   Endo/Other  negative endocrine ROS  Renal/GU negative Renal ROS  negative genitourinary   Musculoskeletal negative musculoskeletal ROS (+)   Abdominal   Peds negative pediatric ROS (+)  Hematology negative hematology ROS (+)   Anesthesia Other Findings   Reproductive/Obstetrics negative OB ROS                             Anesthesia Physical Anesthesia Plan  ASA: 1  Anesthesia Plan: General   Post-op Pain Management: Minimal or no pain anticipated   Induction: Intravenous  PONV Risk Score and Plan: 3 and Ondansetron, Dexamethasone, Midazolam and Treatment may vary due to age or medical condition  Airway Management Planned: LMA  Additional Equipment:   Intra-op Plan:   Post-operative Plan: Extubation in OR  Informed Consent: I have reviewed the patients History and Physical, chart, labs and discussed the procedure including the risks, benefits and alternatives for the proposed anesthesia with the patient or authorized representative who has indicated his/her understanding and acceptance.     Dental advisory given  Plan Discussed with: CRNA and Surgeon  Anesthesia Plan Comments:         Anesthesia Quick Evaluation

## 2022-09-21 NOTE — Op Note (Addendum)
GYNECOLOGIC ONCOLOGY OPERATIVE NOTE  Date of Service: 09/21/2022   Preoperative Diagnosis: VIN3  Postoperative Diagnosis: Same  Procedures: Simple partial vulvectomy and CO2 laser ablation of the vulva  Surgeon: Bernadene Bell, MD  Assistants: Lahoma Crocker, MD  Anesthesia: General  Estimated Blood Loss: Minimal  Fluids: 600 ml, crystalloid  Urine Output: 50 ml, clear yellow  Findings: Acetowhite changes of the posterior forchette with subcentimeter raised lesion. Additional acetowhite changes broadly on the bilateral medial labia minora.   Specimens:  ID Type Source Tests Collected by Time Destination  1 : posterior vulva stitck at 12 o'clock Tissue PATH Gyn biopsy SURGICAL PATHOLOGY Bernadene Bell, MD 09/21/2022 1610   2 : left labial biopsy Tissue PATH Gyn biopsy SURGICAL PATHOLOGY Bernadene Bell, MD 09/21/2022 1628   3 : right labial biopsy Tissue PATH Gyn biopsy SURGICAL PATHOLOGY Bernadene Bell, MD 14/48/1856 3149     Complications:  None  Indications for Procedure: Michelle Padilla is a 36 y.o. 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  Prior to the procedure, all risks, benefits, and alternatives were discussed and informed surgical consent was signed.  Procedure: Patient was taken to the operating room where general anesthesia was achieved.  She was positioned in dorsal lithotomy and prepped and draped.  An in and out catheter was inserted into the bladder and clear yellow urine drained.  A thorough exam of the vulva was done after placing a moist sponge with acetic acid on the vulva with the findings as noted above. The area to be removed was outlined with a marking pen. A scalpel was used to incise around the lesion. Allis clamps are used to grasp the lesion and a skinning vulvectomy was performed in the standard fashion with aid of cautery. After the lesion was removed, the wound bed was made hemostatic with cautery. Several deep 2-0 vicryl suture was placed to bring the wound  edges together followed by deep dermal stitches of 3-0 vicryl and several mattress stiches of 4-0 vicryl on the skin edges to re-appoximate the wound. Hemostasis was noted.  Prior to performing a laser ablation to the more broad surfaces of bilateral medial labia minora, an addson was used to grasp the left and right medial labial minor and small biopsies were obtained with a scalpel on each side. TUsing the CO2 laser, energy was applied with 8 Watts of continuous power.  The ablation was performed down to a second surgical layer.  The wound bed was hemostatic. Silvadene cream was applied to the wound bed.   Patient tolerated the procedure well. Sponge, lap, and instrument counts were correct.  Patient was extubated and taken to the PACU in stable condition.  Bernadene Bell, MD Gynecologic Oncology

## 2022-09-21 NOTE — Interval H&P Note (Signed)
History and Physical Interval Note:  09/21/2022 2:30 PM  Michelle Padilla  has presented today for surgery, with the diagnosis of VIN3.  The various methods of treatment have been discussed with the patient and family. After consideration of risks, benefits and other options for treatment, the patient has consented to  Procedure(s): VULVECTOMY PARTIAL (N/A) CO2 LASER APPLICATION TO VULVA (N/A) as a surgical intervention.  The patient's history has been reviewed, patient examined, no change in status, stable for surgery.  I have reviewed the patient's chart and labs.  Questions were answered to the patient's satisfaction.     Neal Trulson

## 2022-09-21 NOTE — Anesthesia Procedure Notes (Signed)
Procedure Name: LMA Insertion Date/Time: 09/21/2022 3:50 PM  Performed by: Rogers Blocker, CRNAPre-anesthesia Checklist: Patient identified, Emergency Drugs available, Suction available and Patient being monitored Patient Re-evaluated:Patient Re-evaluated prior to induction Oxygen Delivery Method: Circle System Utilized Preoxygenation: Pre-oxygenation with 100% oxygen Induction Type: IV induction Ventilation: Mask ventilation without difficulty LMA: LMA inserted LMA Size: 3.0 Number of attempts: 1 Placement Confirmation: positive ETCO2 Tube secured with: Tape Dental Injury: Teeth and Oropharynx as per pre-operative assessment

## 2022-09-21 NOTE — Anesthesia Postprocedure Evaluation (Signed)
Anesthesia Post Note  Patient: Chartered certified accountant  Procedure(s) Performed: VULVECTOMY PARTIAL (Vulva) CO2 LASER APPLICATION TO VULVA (Vulva)     Patient location during evaluation: PACU Anesthesia Type: General Level of consciousness: awake and alert Pain management: pain level controlled Vital Signs Assessment: post-procedure vital signs reviewed and stable Respiratory status: spontaneous breathing, nonlabored ventilation, respiratory function stable and patient connected to nasal cannula oxygen Cardiovascular status: blood pressure returned to baseline and stable Postop Assessment: no apparent nausea or vomiting Anesthetic complications: no   No notable events documented.  Last Vitals:  Vitals:   09/21/22 1715 09/21/22 1721  BP: 122/80   Pulse: 66 81  Resp: 17 (!) 21  Temp:    SpO2: 99% 98%    Last Pain:  Vitals:   09/21/22 1232  TempSrc: Oral  PainSc: 0-No pain                 Barnet Glasgow

## 2022-09-22 ENCOUNTER — Encounter (HOSPITAL_BASED_OUTPATIENT_CLINIC_OR_DEPARTMENT_OTHER): Payer: Self-pay | Admitting: Psychiatry

## 2022-09-22 ENCOUNTER — Encounter: Payer: Self-pay | Admitting: Gynecologic Oncology

## 2022-09-22 ENCOUNTER — Telehealth: Payer: Self-pay | Admitting: Surgery

## 2022-09-22 NOTE — Telephone Encounter (Signed)
Spoke with Michelle Padilla this morning. She states she is eating, drinking and urinating well. She has not had a BM yet but is passing gas. She is taking senokot as prescribed and encouraged her to drink plenty of water. She denies fever or chills. Incisions are dry and intact. She rates her pain 6/10. Her pain is controlled with Tylenol. Patient requested note stating she is ok to go back to work on 11/1. Requested note be sent to her mychart.    Instructed to call office with any fever, chills, purulent drainage, uncontrolled pain or any other questions or concerns. Patient verbalizes understanding.   Pt aware of post op appointments as well as the office number [phone] and after hours number [phone] to call if she has any questions or concerns

## 2022-09-22 NOTE — Telephone Encounter (Signed)
Patient called to inform that work note has been uploaded to her mychart. Patient verbalized understanding and had no concerns at this time.

## 2022-09-28 ENCOUNTER — Encounter: Payer: Self-pay | Admitting: Psychiatry

## 2022-09-30 ENCOUNTER — Other Ambulatory Visit: Payer: Self-pay | Admitting: Gynecologic Oncology

## 2022-09-30 DIAGNOSIS — R102 Pelvic and perineal pain: Secondary | ICD-10-CM

## 2022-09-30 DIAGNOSIS — D071 Carcinoma in situ of vulva: Secondary | ICD-10-CM

## 2022-09-30 MED ORDER — ESTRADIOL 0.1 MG/GM VA CREA: TOPICAL_CREAM | VAGINAL | 3 refills | Status: DC

## 2022-09-30 NOTE — Progress Notes (Signed)
See mychart messages. Plan for topical estrace cream to the vulvar incision and lasered areas.

## 2022-10-06 ENCOUNTER — Encounter: Payer: Self-pay | Admitting: Nurse Practitioner

## 2022-10-08 ENCOUNTER — Ambulatory Visit: Payer: Self-pay | Admitting: Nurse Practitioner

## 2022-10-10 NOTE — Progress Notes (Unsigned)
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  Postop: - Pt recovering well from surgery and healing appropriately postoperatively - Intraoperative findings and pathology results reviewed: Benign - Ongoing postoperative expectations and precautions reviewed. Continue with no lifting >10lbs through 6 weeks postoperatively - Pt works ***. Okay to return to work at [employer] - Given that uterus is in situ, pt advised that she should continue with pap smear screening per routine until age 69 if she continues with negative/low grade paps.  - Reviewed that after 12 months without menstrual cycles, she should not have any spotting or bleeding.  If this were to occur, she should be evaluated for postmenopausal bleeding.  Vulvar dysplasia: *** - Recommend exam q6 months x5 years then annually  RTC ***.  Clide Cliff, MD Gynecologic Oncology   Medical Decision Making I personally spent  TOTAL *** minutes face-to-face and non-face-to-face in the care of this patient, which includes all pre, intra, and post visit time on the date of service.  *** minutes spent reviewing records prior to the visit *** Minutes in patient contact      *** minutes in other billable services *** minutes charting , conferring with consultants etc.   ----------------------- Reason for Visit: Postop***  Treatment History: ***  Interval History: Pt reports that she is recovering well from surgery. She is using *** for pain. She is eating and drinking well. She is voiding without issue and having regular bowel movements.   ***estrogen?  Past Medical/Surgical History: Past Medical History:  Diagnosis Date   Anxiety     Past Surgical History:  Procedure Laterality Date   CO2 LASER APPLICATION N/A  09/21/2022   Procedure: CO2 LASER APPLICATION TO VULVA;  Surgeon: Clide Cliff, MD;  Location: [facility];  Service: Gynecology;  Laterality: N/A;   VULVECTOMY PARTIAL N/A 09/21/2022   Procedure: VULVECTOMY PARTIAL;  Surgeon: Clide Cliff, MD;  Location: [facility];  Service: Gynecology;  Laterality: N/A;   WISDOM TOOTH EXTRACTION Bilateral     Family History  Problem Relation Age of Onset   Hypertension Mother    Seizures Father    Diabetes Maternal Grandmother    Diabetes Maternal Grandfather    Breast cancer Maternal Great-grandmother    Colon cancer Neg Hx    Ovarian cancer Neg Hx    Endometrial cancer Neg Hx    Pancreatic cancer Neg Hx    Prostate cancer Neg Hx     Social History   Socioeconomic History   Marital status: Married    Spouse name: Not on file   Number of children: Not on file   Years of education: Not on file   Highest education level: Not on file  Occupational History   Not on file  Tobacco Use   Smoking status: Never   Smokeless tobacco: Never  Vaping Use   Vaping Use: Never used  Substance and Sexual Activity   Alcohol use: Not Currently    Comment: Social   Drug use: Never   Sexual activity: Yes    Birth control/protection: I.U.D.  Other Topics Concern   Not on file  Social History Narrative   Not on file   Social Determinants of Health   Financial Resource Strain: Not on file  Food Insecurity: Not  on file  Transportation Needs: Not on file  Physical Activity: Not on file  Stress: Not on file  Social Connections: Not on file    Current Medications:  Current Outpatient Medications:    estradiol (ESTRACE VAGINAL) 0.1 MG/GM vaginal cream, Apply pea size amount of cream to fingertip and apply to vulvar incision and lasered areas at bedtime., Disp: 42.5 g, Rfl: 3   levonorgestrel (KYLEENA) 19.5 MG IUD, 1 each by Intrauterine route once., Disp: , Rfl:    Prenatal Vit-Fe Fumarate-FA  (MULTIVITAMIN-PRENATAL) 27-0.8 MG TABS tablet, Take 1 tablet by mouth daily at 12 noon., Disp: , Rfl:    sertraline (ZOLOFT) 50 MG tablet, Take by mouth., Disp: , Rfl:    VITAMIN D PO, Take by mouth., Disp: , Rfl:   Review of Symptoms: Complete 10-system review is negative except ***as above in Interval History.  Physical Exam: LMP 08/30/2022 (Approximate)  General: ***Alert, oriented, no acute distress. HEENT: ***Normocephalic, atraumatic. Neck symmetric without masses. Sclera anicteric. ***Posterior oropharynx clear. Chest: ***Normal work of breathing. ***Clear to auscultation bilaterally.  ***Port site clean. Cardiovascular: ***Regular rate and rhythm, no murmurs. Abdomen: ***Soft, nontender.  Normoactive bowel sounds.  No masses or hepatosplenomegaly appreciated.  ***Well-healed scar. Extremities: ***Grossly normal range of motion.  Warm, well perfused.  No edema bilaterally. Skin: ***No rashes or lesions noted. Lymphatics: ***No cervical, supraclavicular, or inguinal adenopathy. GU: Normal appearing external genitalia without erythema, excoriation, or lesions.  Speculum exam reveals ***.  Bimanual exam reveals ***.  ***Rectovaginal exam ***. Exam chaperoned by ***  Laboratory & Radiologic Studies: Surgical pathology (09/21/22): FINAL MICROSCOPIC DIAGNOSIS:   A. POSTERIOR VULVA, EXCISION:  - High-grade squamous intraepithelial lesion (VIN 2)  - Low-grade squamous intraepithelial lesion (VIN 1) at 3.00 edge  - Margins not involved by high-grade dysplasia   B. LEFT LABIAL BIOPSY:  -Low-grade squamous intraepithelial lesion (VIN 1)   C. RIGHT LABIAL BIOPSY:  -Low-grade squamous intraepithelial lesion (VIN 1)

## 2022-10-11 ENCOUNTER — Encounter: Payer: Self-pay | Admitting: Gynecologic Oncology

## 2022-10-11 ENCOUNTER — Encounter: Payer: Self-pay | Admitting: Psychiatry

## 2022-10-11 ENCOUNTER — Other Ambulatory Visit: Payer: Self-pay

## 2022-10-11 ENCOUNTER — Inpatient Hospital Stay: Payer: No Typology Code available for payment source | Attending: Psychiatry | Admitting: Psychiatry

## 2022-10-11 VITALS — BP 147/92 | HR 83 | Temp 98.1°F | Resp 18 | Ht 64.0 in | Wt 144.2 lb

## 2022-10-11 DIAGNOSIS — N903 Dysplasia of vulva, unspecified: Secondary | ICD-10-CM | POA: Insufficient documentation

## 2022-10-11 DIAGNOSIS — D071 Carcinoma in situ of vulva: Secondary | ICD-10-CM

## 2022-10-11 DIAGNOSIS — Z7189 Other specified counseling: Secondary | ICD-10-CM

## 2022-10-11 DIAGNOSIS — N901 Moderate vulvar dysplasia: Secondary | ICD-10-CM

## 2022-10-11 DIAGNOSIS — Z9079 Acquired absence of other genital organ(s): Secondary | ICD-10-CM | POA: Insufficient documentation

## 2022-10-11 NOTE — Progress Notes (Signed)
P16 ordered with [facility] for specimen [phone].

## 2022-10-11 NOTE — Patient Instructions (Signed)
It was a pleasure to see you in clinic today. - Recommend exams every 6 months for 5 years, then return to annually.   Thank you very much for allowing me to provide care for you today.  I appreciate your confidence in choosing our Gynecologic Oncology team at [facility].  If you have any questions about your visit today please call our office or send [place] a MyChart message and we will get back to you as soon as possible.

## 2022-10-20 ENCOUNTER — Encounter: Payer: Self-pay | Admitting: Gynecologic Oncology

## 2022-10-20 NOTE — Progress Notes (Signed)
[facility] notified that P16 clarification is still needed as an addendum for Case # [id number].

## 2022-10-22 LAB — SURGICAL PATHOLOGY

## 2023-01-29 IMAGING — US US OB COMP LESS 14 WK
1 series · 15 of 24 positions shown · non-contrast
Comparison: None.

CLINICAL DATA: Heavy bleeding, pregnant

EXAM:
OBSTETRIC <14 WK ULTRASOUND
TECHNIQUE: Transabdominal ultrasound was performed for evaluation of the
gestation as well as the maternal uterus and adnexal regions.

[Series 1: us ob comp less 14 wk · 24 acquisitions, 15 frames shown]
[im 1/24]
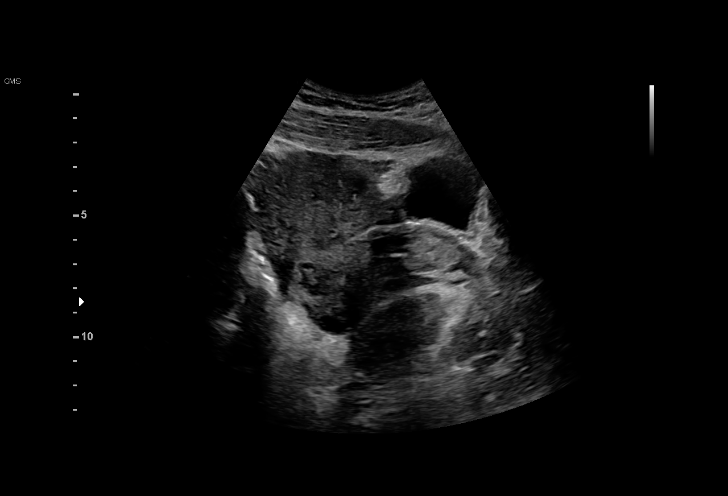
[im 3/24]
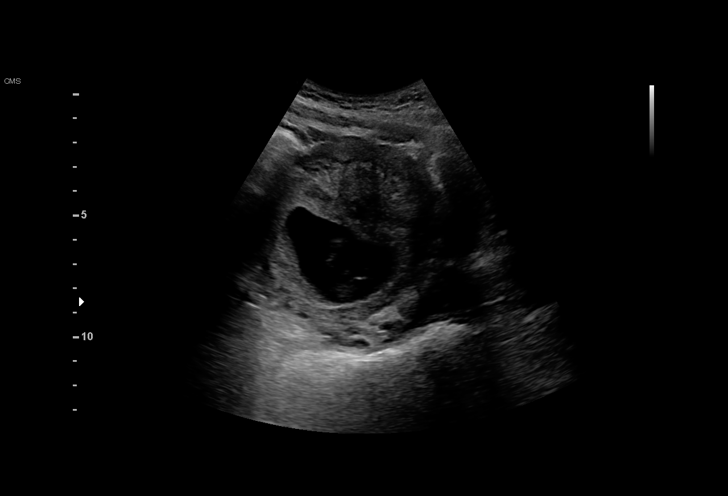
[im 5/24]
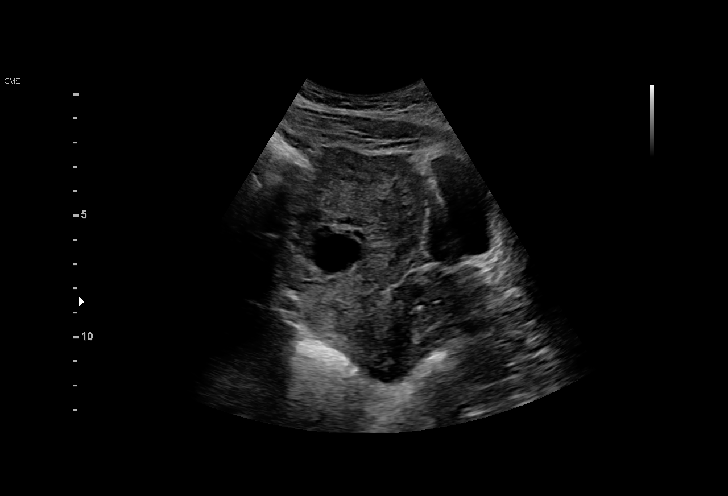
[im 6/24]
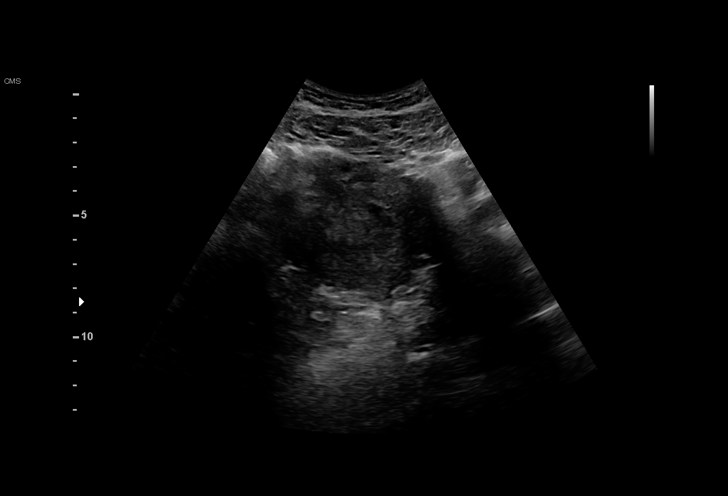
[im 8/24]
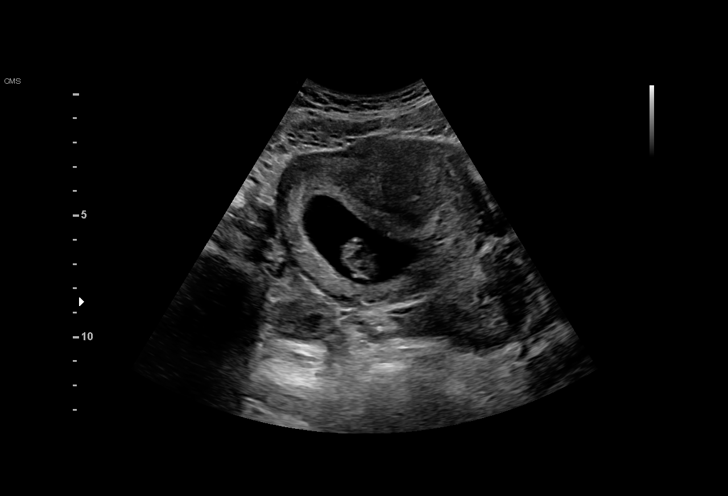
[im 9/24]
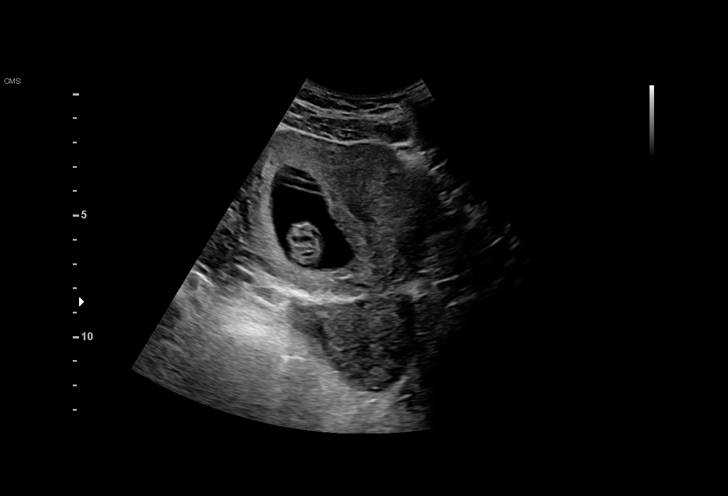
[im 11/24]
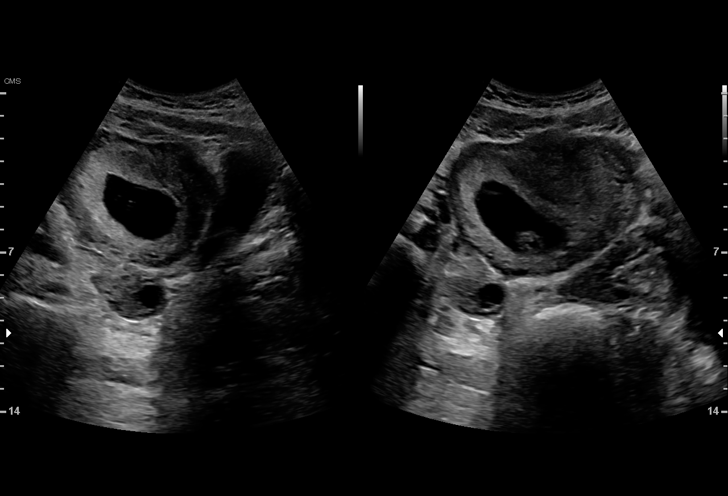
[im 13/24]
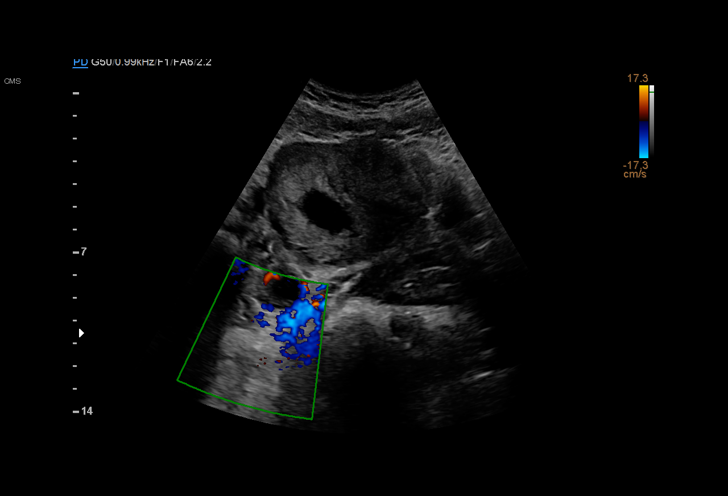
[im 14/24]
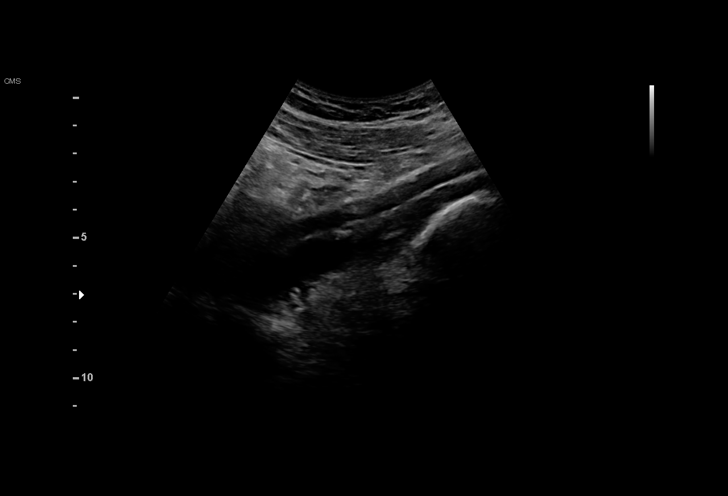
[im 16/24]
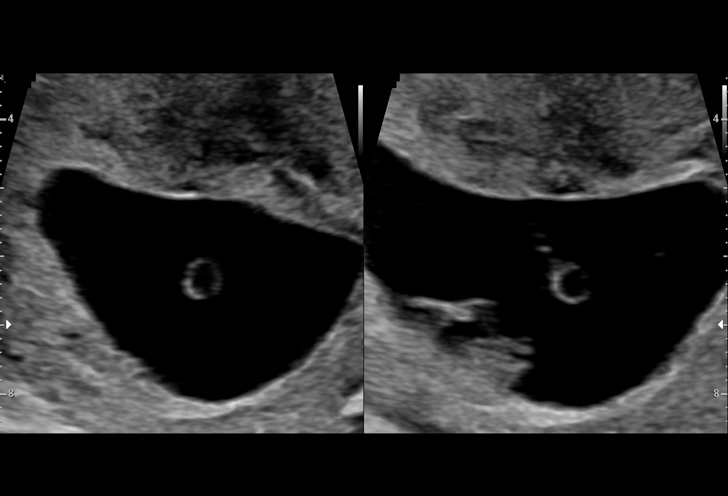
[im 17/24]
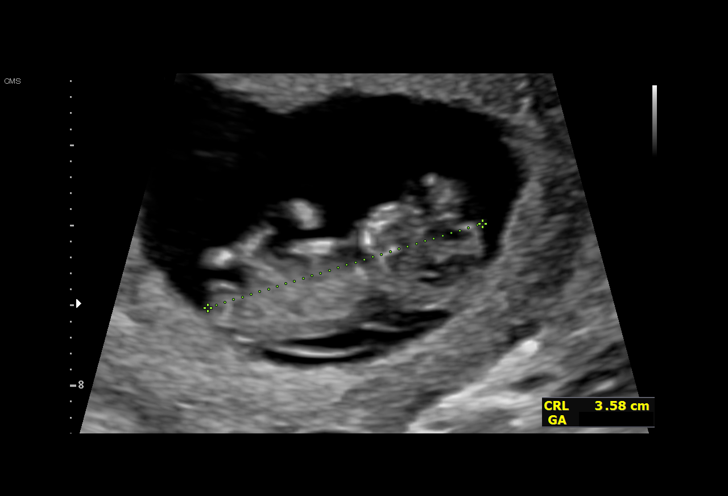
[im 19/24]
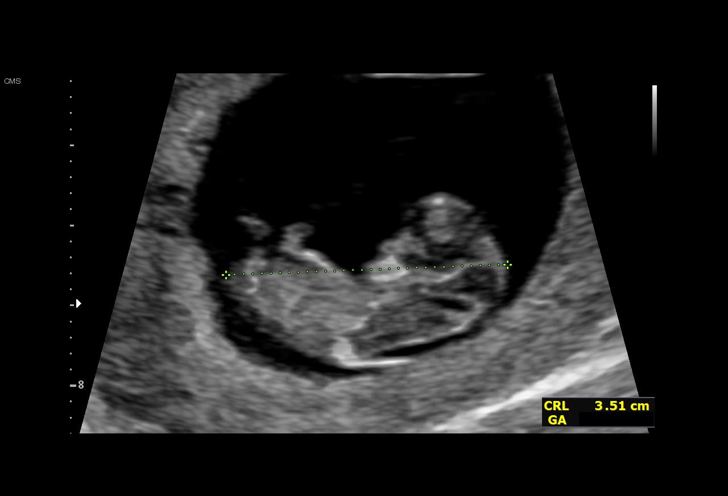
[im 21/24]
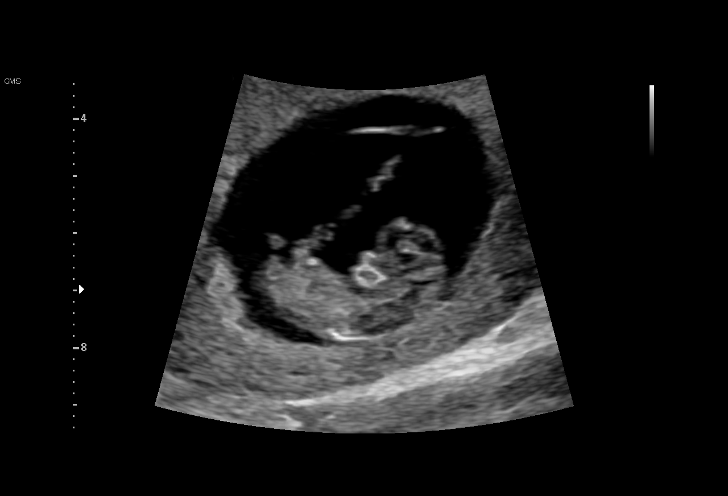
[im 22/24]
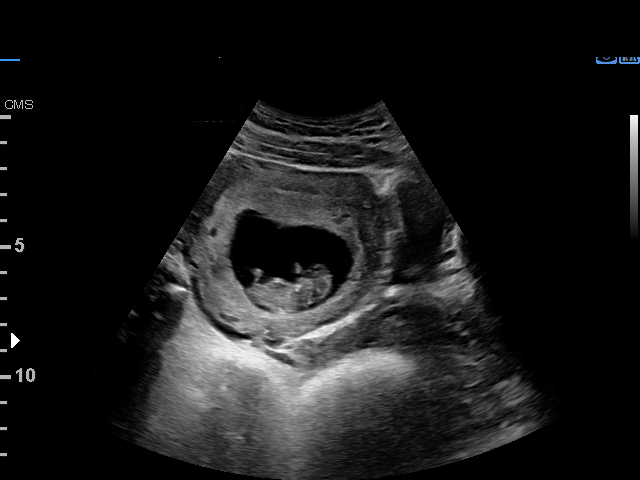
[im 24/24]
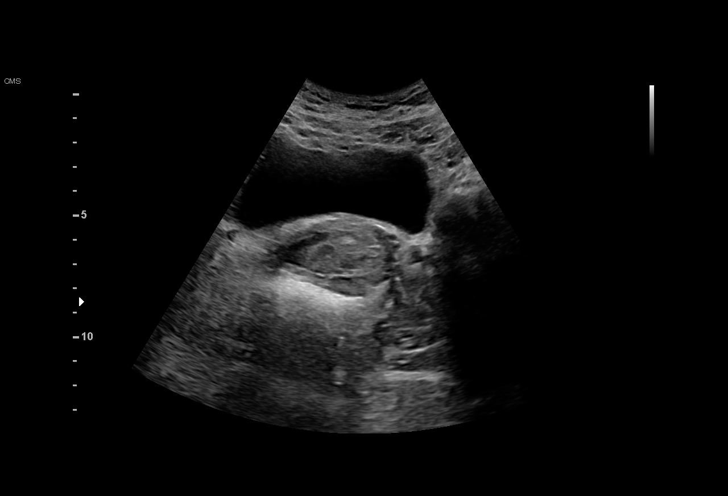

[15 of 24 positions shown; findings below may reference images not displayed]

FINDINGS: Intrauterine gestational sac: Single

Yolk sac:  Visualized.

Embryo:  Visualized.

Cardiac Activity: Visualized.

Heart Rate: 182 bpm

CRL:   36.3 mm   10 w 4 d                  US EDC: 07/06/2022

Subchorionic hemorrhage:  None visualized.

Maternal uterus/adnexae: Right ovary is normal. Left ovary is not
visualized. Blood product in the vaginal vault.
IMPRESSION: 1. Single intrauterine gestation at sonographic gestational age of
10 weeks, 4 days. Fetal heart rate 182 beats per minute. EDD
07/26/2022.

2.  Blood product in the vaginal vault.

## 2023-04-06 IMAGING — US US MFM OB DETAIL+14 WK
1 series · 13 of 28 positions shown · non-contrast
Comparison: none

[Series 1: us mfm ob detail+14 wk · 75 acquisitions, 13 frames shown]
[im 3/75]
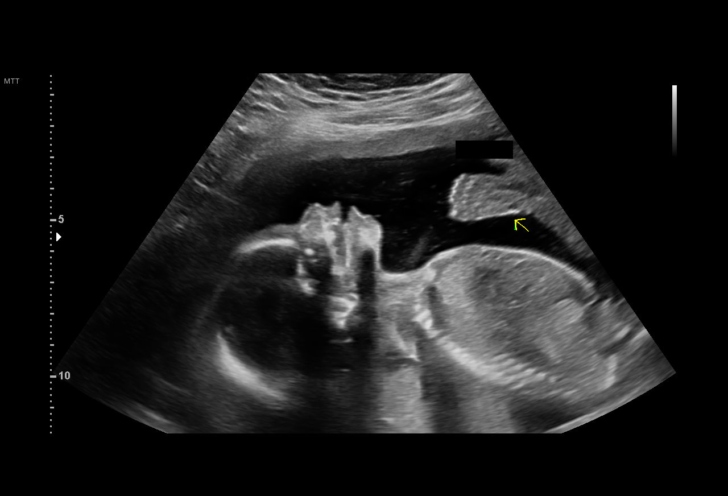
[im 9/75]
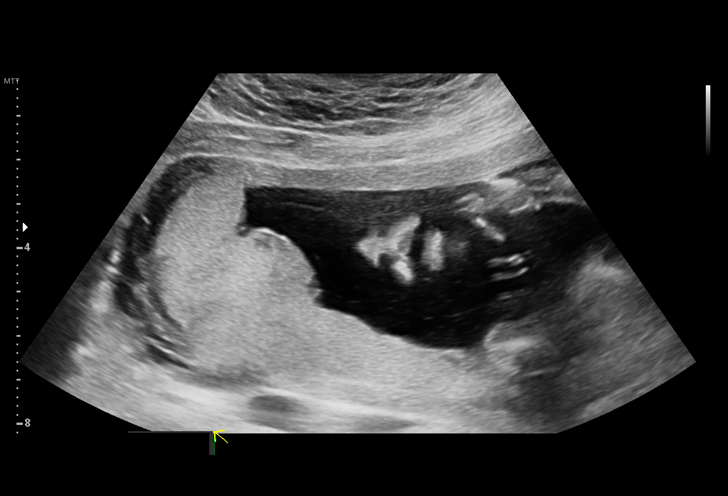
[im 14/75]
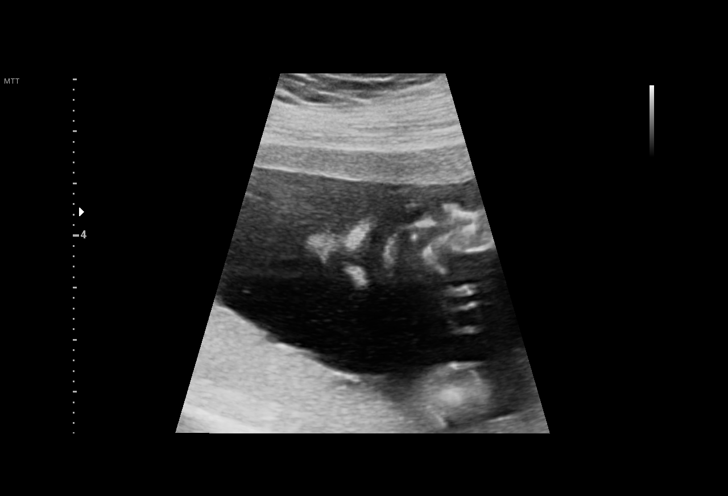
[im 20/75]
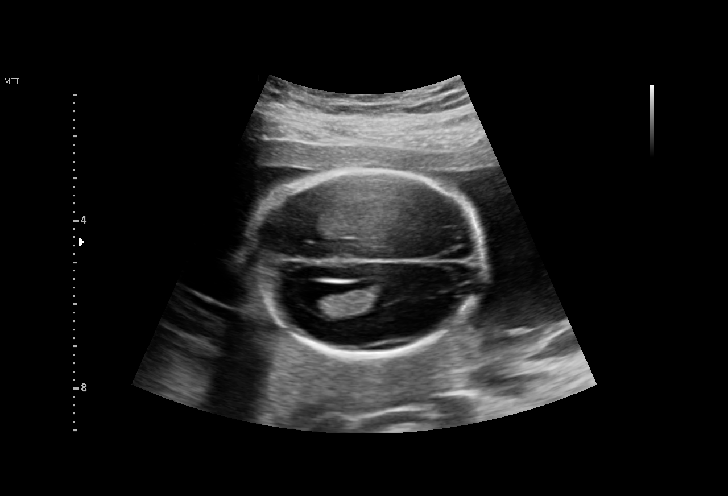
[im 25/75]
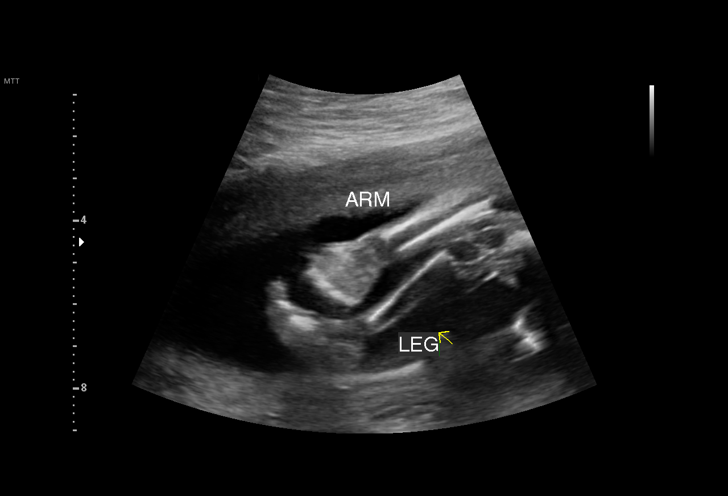
[im 31/75]
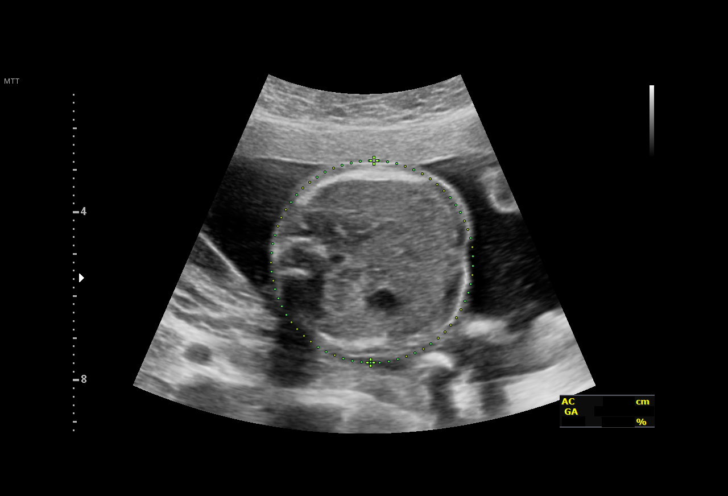
[im 39/75]
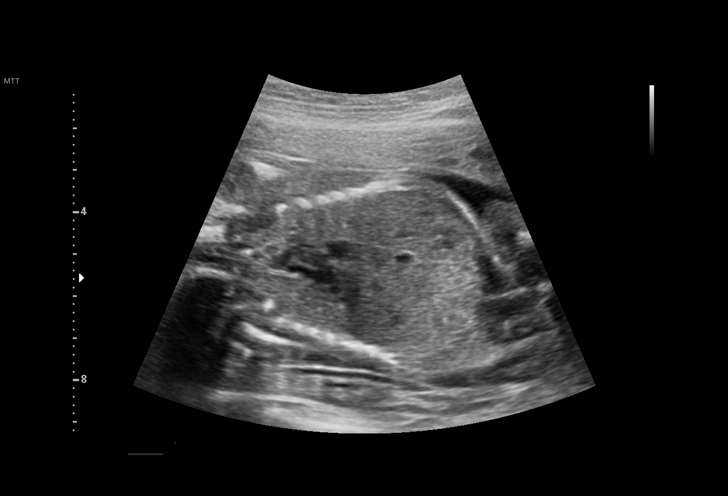
[im 44/75]
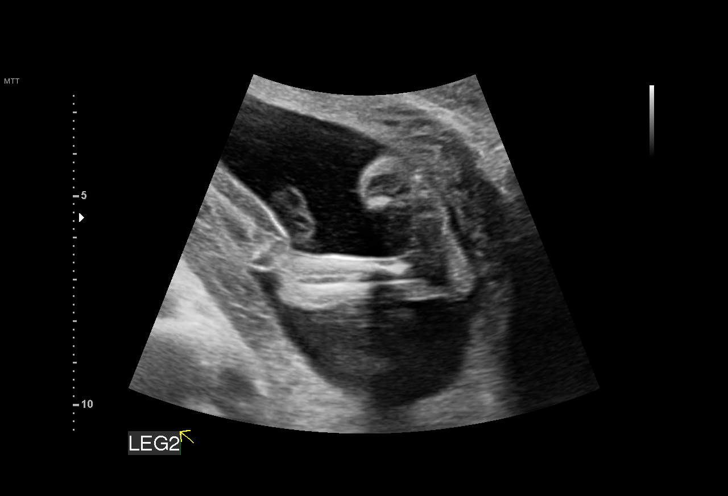
[im 50/75]
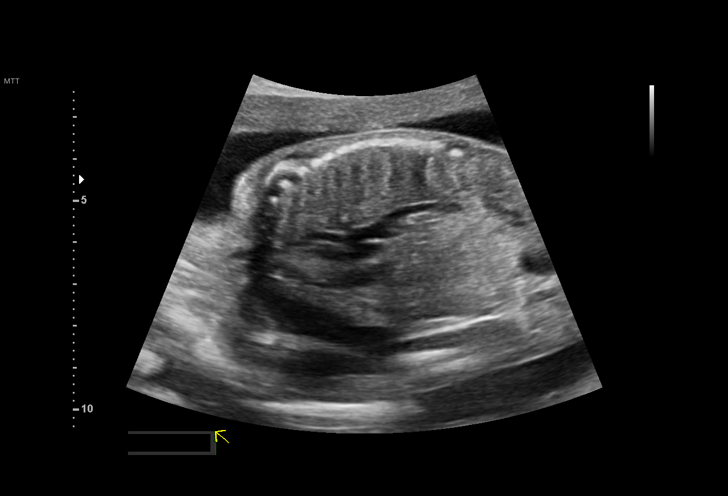
[im 55/75]
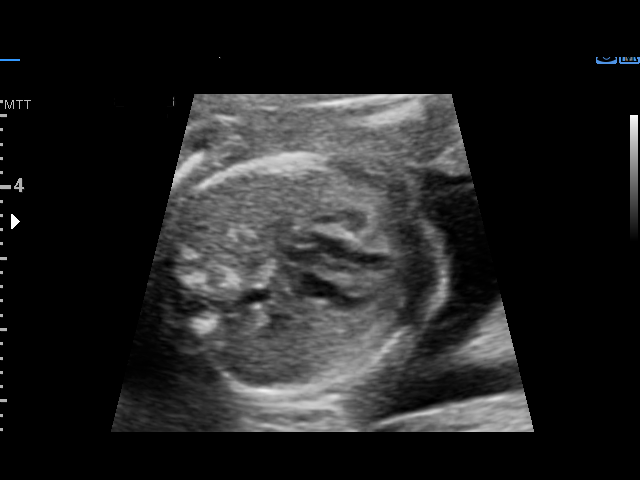
[im 61/75]
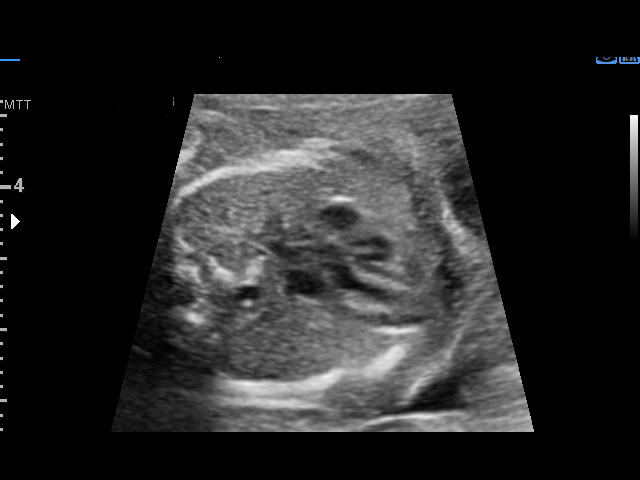
[im 66/75]
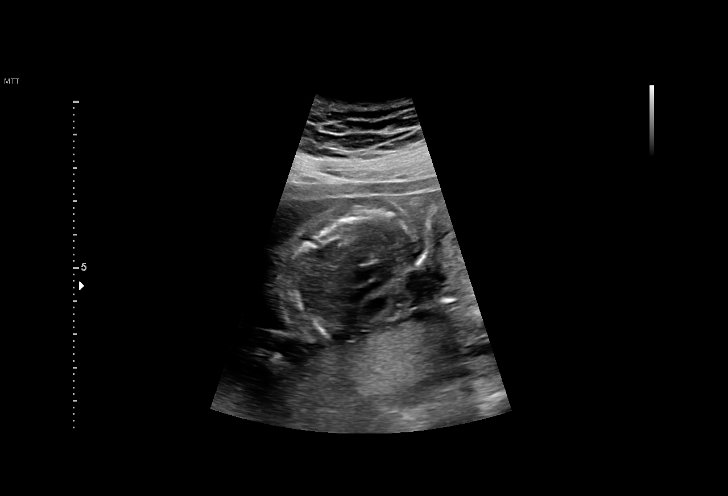
[im 72/75]
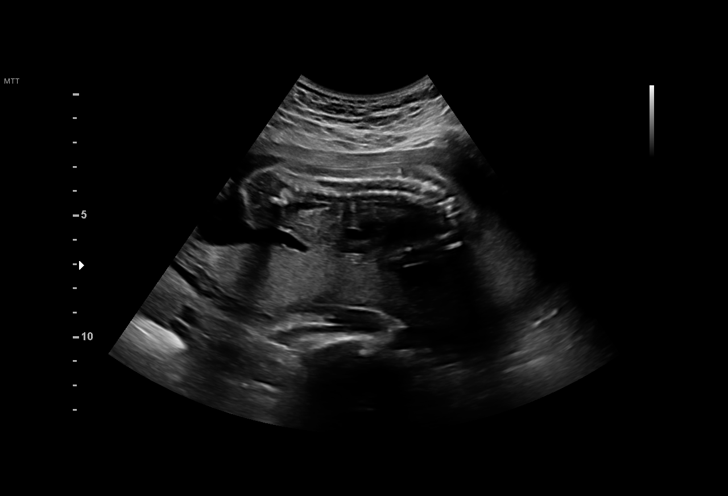

[13 of 28 positions shown; findings below may reference images not displayed]

OBGYN

Indications

 20 weeks gestation of pregnancy
 Advanced maternal age multigravida 35+,
 second trimester (35)
 LR NIPS, Neg AFP
Fetal Evaluation

 Num Of Fetuses:         1
 Cardiac Activity:       Observed

 Amniotic Fluid
 AFI FV:      Within normal limits
OB History

 Gravidity:    1
 Living:       0
Gestational Age

 LMP:           20w 1d        Date:  10/19/21                 EDD:   07/26/22
 Best:          20w 1d     Det. By:  LMP  (10/19/21)          EDD:   07/26/22
Anatomy
 Cranium:               Appears normal         Aortic Arch:            Not well visualized
 Cavum:                 Appears normal         Ductal Arch:            Not well visualized
 Ventricles:            Appears normal         Diaphragm:              Appears normal
 Choroid Plexus:        Appears normal         Stomach:                Appears normal, left
                                                                       sided
 Cerebellum:            Appears normal         Abdomen:                Appears normal
 Posterior Fossa:       Appears normal         Abdominal Wall:         Appears nml (cord
                                                                       insert, abd wall)
 Nuchal Fold:           Appears normal         Cord Vessels:           Appears normal (3
                                                                       vessel cord)
 Face:                  Appears normal         Kidneys:                Appear normal
                        (orbits and profile)
 Lips:                  Abnormal               Bladder:                Appears normal
                        unilateral cleft lip
 Thoracic:              Appears normal         Spine:                  Appears normal
 Heart:                 Appears normal         Upper Extremities:      Appears normal
                        (4CH, axis, and
                        situs)
 RVOT:                  Appears normal         Lower Extremities:      Appears normal
 LVOT:                  Not well visualized

 Other:  VC, 3VV and 3VTV visualized. Parents do not wish to know sex of
         fetus.
Cervix Uterus Adnexa

 Cervix
 Normal appearance by transabdominal scan.

 Right Ovary
 Visualized.

 Left Ovary
 Visualized.
Comments

 MFM Consultation.

 Ms. Ha Di is a 35 yo G1P0 at 20 w 1 d who is were with
 an EDD of 07/26/22. She is seen at the request of Jim
 Merryman, Pura regarding a finding of unilateral cleft lip.

 She is doing well today without s/sx of preterm labor or
 preeclampsia.

 She a low risk NIPS and AFP.

 Her Blood pressure was 111/72.

 Please see [REDACTED] for detailed medical history.

 Imaging today:

 We observed a single intrauterine pregnancy with
 measurements consistent with dates.
 The fetal anatomy was normal with exception of a right
 unilateral cleft lip. The fetal palate was suboptimally seen.
 Suboptimal views of the fetal heart was also observed.

 There was good fetal movement and amniotic fluid volume.

 Impression/Counseling:

 Cleft lip and palate is a common facial malformation that
 typically runs between the nostrils and may involve the
 central part of the posterior palate. Linear defect extending
 from upper lip to the nostril, while midline clefts are often
 associated with brain malformations (e.g.,
 holoprosencephaly). The majority of cases have a
 multifactorial etiology and inheritance. Additional risk factors,
 including hyperthermia, chronic steroids, methotrexate,
 alcohol, hydantoin, trimethadione, and aminopterin, maternal
 rubella, phenylketonuria, folic acid deficiency, and zinc
 deficiency were reviewed.

 In general, the prognosis for a good cosmetic and functional
 repair with isolated cleft lip and/or cleft palate is excellent.
 Otherwise, the prognosis is dependent upon any associated
 malformations or a syndrome diagnosis.  The risks, benefits,
 and alternatives of invasive karyotype analysis in the
 presence of a cleft lip were reviewed with your patient. We
 quote a risk for fetal loss as a result of an amniocentesis of
 approximately 1/500 and highlighted that normal test results
 do not necessarily guarantee the birth of a normal infant as
 the general population risk of having a child with any
 congenital abnormality is approximately 3-4%.  At the
 conclusion of our discussion, Ms. Rahimallah Malat an
 informed decision to forgo further testing.

 At this time we recommend follow-up in 4 weeks, at which
 time for fetal growth assessment.  Thereafter, serial
 ultrasounds for growth, as well as evaluation of
 polyhydramnios may be considered.  As far as delivery, the
 site for delivery should be where there are appropriate
 facilities and support staff for care and management of an
 infant with a cleft (possible respiratory and feeding
 complications).

 All questions answered.

 I spent 30 minutes with > 50% in face to face consultation.

## 2023-04-19 ENCOUNTER — Telehealth: Payer: Self-pay

## 2023-04-19 NOTE — Telephone Encounter (Signed)
Pt called to scheduled 6 month f/u appt with Dr. Alvester Morin.  Appt made for 05/23/23 @ 3:15 pm. Pt verbalized understanding.

## 2023-05-12 ENCOUNTER — Ambulatory Visit (INDEPENDENT_AMBULATORY_CARE_PROVIDER_SITE_OTHER): Payer: No Typology Code available for payment source | Admitting: Nurse Practitioner

## 2023-05-12 VITALS — BP 108/66 | HR 76 | Temp 98.4°F | Ht 64.0 in | Wt 142.4 lb

## 2023-05-12 DIAGNOSIS — Z0001 Encounter for general adult medical examination with abnormal findings: Secondary | ICD-10-CM | POA: Diagnosis not present

## 2023-05-12 DIAGNOSIS — R944 Abnormal results of kidney function studies: Secondary | ICD-10-CM | POA: Diagnosis not present

## 2023-05-12 DIAGNOSIS — F419 Anxiety disorder, unspecified: Secondary | ICD-10-CM | POA: Diagnosis not present

## 2023-05-12 DIAGNOSIS — D649 Anemia, unspecified: Secondary | ICD-10-CM

## 2023-05-12 LAB — BASIC METABOLIC PANEL
BUN: 16 mg/dL (ref 6–23)
CO2: 29 mEq/L (ref 19–32)
Calcium: 9.4 mg/dL (ref 8.4–10.5)
Chloride: 101 mEq/L (ref 96–112)
Creatinine, Ser: 0.96 mg/dL (ref 0.40–1.20)
GFR: 75.56 mL/min (ref >60.00)
Glucose, Bld: 91 mg/dL (ref 70–99)
Potassium: 4 mEq/L (ref 3.5–5.1)
Sodium: 138 mEq/L (ref 135–145)

## 2023-05-12 LAB — CBC
HCT: 39.7 % (ref 36.0–46.0)
Hemoglobin: 13 g/dL (ref 12.0–15.0)
MCHC: 32.8 g/dL (ref 30.0–36.0)
MCV: 80.2 fl (ref 78.0–100.0)
Platelets: 165 10*3/uL (ref 150.0–400.0)
RBC: 4.95 Mil/uL (ref 3.87–5.11)
RDW: 14.5 % (ref 11.5–15.5)
WBC: 5.7 10*3/uL (ref 4.0–10.5)

## 2023-05-12 LAB — MICROALBUMIN / CREATININE URINE RATIO
Creatinine,U: 68.8 mg/dL
Microalb Creat Ratio: 1 mg/g (ref 0.0–30.0)
Microalb, Ur: <0.7 mg/dL (ref 0.0–1.9)

## 2023-05-12 LAB — FOLATE: Folate: >23.8 ng/mL (ref >5.9)

## 2023-05-12 LAB — VITAMIN B12: Vitamin B-12: 347 pg/mL (ref 211–911)

## 2023-05-12 LAB — IRON: Iron: 29 ug/dL — ABNORMAL LOW (ref 42–145)

## 2023-05-12 LAB — FERRITIN: Ferritin: 31.2 ng/mL (ref 10.0–291.0)

## 2023-05-12 NOTE — Assessment & Plan Note (Signed)
Incidental finding on labs, recheck metabolic panel as well as check urine for albuminuria.  Patient was encouraged to focus on hydrating with at least 60 ounces of water per day and avoid excessive salt.  Also should continue to avoid excessive use of NSAIDs.  Further recommendations may be made based upon labs.

## 2023-05-12 NOTE — Assessment & Plan Note (Addendum)
Patient reports being up-to-date with Pap smear, will request previous records from OB/GYN.  Reports that she had tetanus vaccine administered with most recent pregnancy, again will request records from OB/GYN.  Up-to-date with hep C and HIV screening.  Encouraged her to continue focusing on healthy lifestyle and discussed next 10 years of cancer screening recommendations.  Handout provided.

## 2023-05-12 NOTE — Assessment & Plan Note (Signed)
Incidental finding on last labs Check CBC, iron, ferritin, B12, folate levels.  Further recommendations may be made based upon these results.

## 2023-05-12 NOTE — Progress Notes (Signed)
Complete physical exam  Patient: Michelle Padilla   DOB: 07/10/1986   36 y.o. Female  MRN: [id number]  Subjective:    No chief complaint on file.   Michelle Padilla is a 37 y.o. female who presents today for a complete physical exam. She reports consuming a general diet. Home exercise routine includes calisthenics and weight limiting, running, yoga. She generally feels well. She reports sleeping poorly. She does have additional problems to discuss today.   Anemia: incidental finding on last labs.  Currently has IUD for contraception and reports that she has low-volume blood loss with menstrual cycles currently.  Does reports she normally runs on the lower side of her hemoglobin.  Decreased GFR: Incidental finding on last labs.  GFR of 62.42.  Admits to most likely not drinking enough during the day.  Does not use NSAIDs frequently.   Anxiety: on sertraline 50mg /day as prescribed by OBGYN. 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     Most recent fall risk assessment:    05/12/2023    1:04 PM  Fall Risk   Falls in the past year? 0  Number falls in past yr: 0  Injury with Fall? 0  Risk for fall due to : No Fall Risks  Follow up Falls evaluation completed     Most recent depression screenings:    05/12/2023    1:04 PM 09/10/2022    1:12 PM  PHQ 2/9 Scores  PHQ - 2 Score 0 0  PHQ- 9 Score  0    Vision:Not within last year , Dental: No current dental problems and No regular dental care , and STD: Declined to answer.   Past Medical History:  Diagnosis Date   Anxiety    Past Surgical History:  Procedure Laterality Date   CO2 LASER APPLICATION N/A 09/21/2022   Procedure: CO2 LASER APPLICATION TO VULVA;  Surgeon: Clide Cliff, MD;  Location: [facility];  Service: Gynecology;  Laterality: N/A;   VULVECTOMY PARTIAL  N/A 09/21/2022   Procedure: VULVECTOMY PARTIAL;  Surgeon: Clide Cliff, MD;  Location: [facility];  Service: Gynecology;  Laterality: N/A;   WISDOM TOOTH EXTRACTION Bilateral    Family History  Problem Relation Age of Onset   Hypertension Mother    Seizures Father    Diabetes Maternal Grandmother    Diabetes Maternal Grandfather    Breast cancer Maternal Great-grandmother    Colon cancer Neg Hx    Ovarian cancer Neg Hx    Endometrial cancer Neg Hx    Pancreatic cancer Neg Hx    Prostate cancer Neg Hx       Patient Care Team: Elenore Paddy, NP as PCP - General (Nurse Practitioner)   Outpatient Medications Prior to Visit  Medication Sig   levonorgestrel (KYLEENA) 19.5 MG IUD 1 each by Intrauterine route once.   sertraline (ZOLOFT) 50 MG tablet Take by mouth.   VITAMIN D PO Take by mouth.   [DISCONTINUED] estradiol (ESTRACE VAGINAL) 0.1 MG/GM vaginal cream Apply pea size amount of cream to fingertip and apply to vulvar incision and lasered areas at bedtime.   [DISCONTINUED] Prenatal Vit-Fe Fumarate-FA (MULTIVITAMIN-PRENATAL) 27-0.8 MG TABS tablet Take 1 tablet by mouth daily at 12 noon. (Patient not taking: Reported on 05/12/2023)   No facility-administered medications prior to visit.    Review of Systems  Constitutional:  Negative  for fever, malaise/fatigue and weight loss.  HENT:  Negative for congestion and sore throat.   Eyes:  Negative for blurred vision and double vision.  Respiratory:  Negative for cough, shortness of breath and wheezing.   Cardiovascular:  Negative for chest pain and palpitations.  Gastrointestinal:  Negative for abdominal pain, blood in stool, constipation, nausea and vomiting.  Genitourinary:  Negative for hematuria.  Skin:  Negative for itching and rash.  Neurological:  Negative for dizziness, seizures, loss of consciousness and headaches.  Psychiatric/Behavioral:  Positive for depression. Negative for suicidal ideas. The patient  is nervous/anxious.           Objective:     BP 108/66   Pulse 76   Temp 98.4 F (36.9 C) (Temporal)   Ht 5\' 4"  (1.626 m)   Wt 142 lb 6 oz (64.6 kg)   SpO2 96%   BMI 24.44 kg/m  BP Readings from Last 3 Encounters:  05/12/23 108/66  10/11/22 (!) 147/92  09/21/22 125/76   Wt Readings from Last 3 Encounters:  05/12/23 142 lb 6 oz (64.6 kg)  10/11/22 144 lb 3.2 oz (65.4 kg)  09/21/22 149 lb 1.6 oz (67.6 kg)        05/12/2023    1:04 PM 09/10/2022    1:12 PM  PHQ9 SCORE ONLY  PHQ-9 Total Score 0 0      05/12/2023    1:21 PM  GAD 7 : Generalized Anxiety Score  Nervous, Anxious, on Edge 2  Control/stop worrying 1  Worry too much - different things 1  Trouble relaxing 1  Restless 1  Easily annoyed or irritable 1  Afraid - awful might happen 1  Total GAD 7 Score 8      Physical Exam Vitals reviewed.  Constitutional:      Appearance: Normal appearance.  HENT:     Head: Normocephalic and atraumatic.     Right Ear: Tympanic membrane, ear canal and external ear normal.     Left Ear: Tympanic membrane, ear canal and external ear normal.  Eyes:     General:        Right eye: No discharge.        Left eye: No discharge.     Extraocular Movements: Extraocular movements intact.     Conjunctiva/sclera: Conjunctivae normal.     Pupils: Pupils are equal, round, and reactive to light.  Neck:     Vascular: No carotid bruit.  Cardiovascular:     Rate and Rhythm: Normal rate and regular rhythm.     Pulses: Normal pulses.     Heart sounds: Normal heart sounds. No murmur heard. Pulmonary:     Effort: Pulmonary effort is normal.     Breath sounds: Normal breath sounds.  Chest:  Breasts:    Breasts are symmetrical.     Right: Normal.     Left: Normal.  Abdominal:     General: Abdomen is flat. Bowel sounds are normal. There is no distension.     Palpations: Abdomen is soft. There is no mass.     Tenderness: There is no abdominal tenderness.  Musculoskeletal:         General: No tenderness.     Cervical back: Neck supple. No muscular tenderness.     Right lower leg: No edema.     Left lower leg: No edema.  Lymphadenopathy:     Cervical: No cervical adenopathy.     Upper Body:     Right upper body:  No supraclavicular adenopathy.     Left upper body: No supraclavicular adenopathy.  Skin:    General: Skin is warm and dry.  Neurological:     General: No focal deficit present.     Mental Status: She is alert and oriented to person, place, and time.     Motor: No weakness.     Gait: Gait normal.  Psychiatric:        Mood and Affect: Mood normal.        Behavior: Behavior normal.        Judgment: Judgment normal.      No results found for any visits on 05/12/23. Last CBC Lab Results  Component Value Date   WBC 4.4 09/10/2022   HGB 11.1 (L) 09/10/2022   HCT 33.2 (L) 09/10/2022   MCV 74.2 (L) 09/10/2022   MCH 23.1 (L) 07/22/2022   RDW 19.4 (H) 09/10/2022   PLT 176.0 09/10/2022   Last metabolic panel Lab Results  Component Value Date   GLUCOSE 86 09/10/2022   NA 139 09/10/2022   K 3.9 09/10/2022   CL 103 09/10/2022   CO2 28 09/10/2022   BUN 16 09/10/2022   CREATININE 1.13 09/10/2022   GFRNONAA >60 07/21/2022   CALCIUM 9.4 09/10/2022   PROT 6.9 09/10/2022   ALBUMIN 4.4 09/10/2022   BILITOT 0.3 09/10/2022   ALKPHOS 46 09/10/2022   AST 20 09/10/2022   ALT 16 09/10/2022   ANIONGAP 9 07/21/2022   Last lipids Lab Results  Component Value Date   CHOL 204 (H) 09/10/2022   HDL 76.60 09/10/2022   LDLCALC 102 (H) 09/10/2022   TRIG 124.0 09/10/2022   CHOLHDL 3 09/10/2022        Assessment & Plan:    Routine Health Maintenance and Physical Exam  Immunization History  Administered Date(s) Administered   PFIZER(Purple Top)SARS-COV-2 Vaccination 02/18/2020, 03/14/2020   Tdap 04/29/2022    Health Maintenance  Topic Date Due   PAP SMEAR-Modifier  Never done   COVID-19 Vaccine (3 - Pfizer risk series) 05/28/2023 (Originally  04/11/2020)   INFLUENZA VACCINE  06/30/2023   DTaP/Tdap/Td (2 - Td or Tdap) 04/29/2032   Hepatitis C Screening  Completed   HIV Screening  Completed   HPV VACCINES  Aged Out    Discussed health benefits of physical activity, and encouraged her to engage in regular exercise appropriate for her age and condition.  Problem List Items Addressed This Visit       Other   Anemia    Incidental finding on last labs Check CBC, iron, ferritin, B12, folate levels.  Further recommendations may be made based upon these results.      Relevant Orders   CBC   Iron   Ferritin   Basic metabolic panel   Vitamin B12   Folate   Decreased GFR    Incidental finding on labs, recheck metabolic panel as well as check urine for albuminuria.  Patient was encouraged to focus on hydrating with at least 60 ounces of water per day and avoid excessive salt.  Also should continue to avoid excessive use of NSAIDs.  Further recommendations may be made based upon labs.      Relevant Orders   CBC   Iron   Ferritin   Basic metabolic panel   Microalbumin / creatinine urine ratio   Encounter for general adult medical examination with abnormal findings - Primary    Patient reports being up-to-date with Pap smear, will request previous records from OB/GYN.  Reports that she had tetanus vaccine administered with most recent pregnancy, again will request records from OB/GYN.  Up-to-date with hep C and HIV screening.  Encouraged her to continue focusing on healthy lifestyle and discussed next 10 years of cancer screening recommendations.  Handout provided.      Anxiety    Chronic, improved with use of sertraline.  Patient is experiencing sexual dysfunction which is an unwanted side effect.  We did discuss potential other options for pharmacological treatment regarding her anxiety.  She will consider this and we can discuss this further if she would like to consider changing to a different agent, she was also encouraged  to discuss this with her OB/GYN as well.      Return in about 6 months (around 11/11/2023) for F/U with Maralyn Sago.  In addition to performing her annual physical exam also performed an office visit as detailed above.     Elenore Paddy, NP

## 2023-05-12 NOTE — Assessment & Plan Note (Signed)
Chronic, improved with use of sertraline.  Patient is experiencing sexual dysfunction which is an unwanted side effect.  We did discuss potential other options for pharmacological treatment regarding her anxiety.  She will consider this and we can discuss this further if she would like to consider changing to a different agent, she was also encouraged to discuss this with her OB/GYN as well.

## 2023-05-23 ENCOUNTER — Other Ambulatory Visit: Payer: Self-pay

## 2023-05-23 ENCOUNTER — Inpatient Hospital Stay: Payer: No Typology Code available for payment source | Attending: Psychiatry | Admitting: Psychiatry

## 2023-05-23 VITALS — BP 132/80 | HR 77 | Ht 64.57 in | Wt 141.2 lb

## 2023-05-23 DIAGNOSIS — N901 Moderate vulvar dysplasia: Secondary | ICD-10-CM | POA: Insufficient documentation

## 2023-05-23 DIAGNOSIS — Z9079 Acquired absence of other genital organ(s): Secondary | ICD-10-CM | POA: Insufficient documentation

## 2023-05-23 DIAGNOSIS — N898 Other specified noninflammatory disorders of vagina: Secondary | ICD-10-CM

## 2023-05-23 DIAGNOSIS — N941 Unspecified dyspareunia: Secondary | ICD-10-CM | POA: Insufficient documentation

## 2023-05-23 NOTE — Patient Instructions (Signed)
It was a pleasure to see you in clinic today. - I recommend trying coconut oil for vaginal moisturizing. Place nightly. - You can also use the estrogen cream. Place a pea size on your fingertip and apply to the affected area on the outside. You can do this night for the first 2 weeks and then reduce to twice a week. - Return visit planned for 6 months.  Thank you very much for allowing me to provide care for you today.  I appreciate your confidence in choosing our Gynecologic Oncology team at [facility].  If you have any questions about your visit today please call our office or send [place] a MyChart message and we will get back to you as soon as possible.

## 2023-05-23 NOTE — Progress Notes (Unsigned)
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 ***  Vulvar dysplasia: - Reviewed the nature of vulvar dysplasia. Reviewed pathology results in detail. - Discussed etiology from HPV or from inflammatory changes. Based on pap smears that I have available, HPV negative. Pt denies HPV positive in past. - Reviewed HPV vaccine if not previously received. She will discuss with her PCP. - Will see if pathology can weigh in on HPV or non-HPV related dysplasia. Suspect that patient may have non-HPV related dysplasia, also given prior biopsy with psoriaform changes. In this case, patient may need additional steroid use in future. - Okay to continue with topical estrogen for now (suggest through 8 weeks postop), then can discontinue. - Recommend exam q6 months x2 years then annually. Discussed that this can be here or with her regular gynecologist. She would like to continue with her Gynecologist.***  Elevated Blood pressure: - Stage II hypertension again today. - Pt reports that she has notified her PCP to make them aware. Okay at home prior to visit (120s/80s).  ***exam good Coconut oil Vaginal estrogn to introitus Followup 48mo  RTC ***.  Clide Cliff, MD Gynecologic Oncology   Medical Decision Making I personally spent  TOTAL *** minutes face-to-face and non-face-to-face in the care of this patient, which includes all pre, intra, and post visit time on the date of service.  2 minutes spent reviewing records prior to the visit 15 Minutes in patient contact 5 minutes charting , conferring with consultants etc.   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 Simple partial vulvectomy, CO2 laser ablation 09/21/22: VIN2 completed resected; VIN1 positive margin  Interval History: ***n o lesion, itching, bleeding Feels dry, not lubricated Sex painful since surgery, but with initial insertion Kylena iud in Water based lube helps some   Past Medical/Surgical History: Past Medical History:  Diagnosis Date   Anxiety     Past Surgical History:  Procedure Laterality Date   CO2 LASER APPLICATION N/A 09/21/2022   Procedure: CO2 LASER APPLICATION TO VULVA;  Surgeon: Clide Cliff, MD;  Location: [facility];  Service: Gynecology;  Laterality: N/A;   VULVECTOMY PARTIAL N/A 09/21/2022   Procedure: VULVECTOMY PARTIAL;  Surgeon: Clide Cliff, MD;  Location: [facility];  Service: Gynecology;  Laterality: N/A;   WISDOM TOOTH EXTRACTION Bilateral     Family History  Problem Relation Age of Onset   Hypertension Mother    Seizures Father    Diabetes Maternal Grandmother    Diabetes Maternal Grandfather    Breast cancer Maternal Great-grandmother    Colon cancer Neg Hx    Ovarian cancer Neg Hx    Endometrial cancer Neg Hx    Pancreatic cancer Neg Hx    Prostate cancer Neg Hx     Social History   Socioeconomic History   Marital status: Married    Spouse name: Not on file   Number of children: Not on file   Years of education: Not on file   Highest education level: Master's degree (e.g., MA, MS, MEng, MEd, MSW, MBA)  Occupational History   Not on file  Tobacco Use   Smoking status: Never  Smokeless tobacco: Never  Vaping Use   Vaping Use: Never used  Substance and Sexual Activity   Alcohol use: Not Currently    Comment: Social   Drug use: Never   Sexual activity: Yes    Birth control/protection: I.U.D.  Other Topics Concern   Not on file  Social History Narrative   Not on file   Social Determinants of Health   Financial Resource  Strain: Low Risk  (05/12/2023)   Overall Financial Resource Strain (CARDIA)    Difficulty of Paying Living Expenses: Not hard at all  Food Insecurity: No Food Insecurity (05/12/2023)   Hunger Vital Sign    Worried About Running Out of Food in the Last Year: Never true    Ran Out of Food in the Last Year: Never true  Transportation Needs: No Transportation Needs (05/12/2023)   PRAPARE - Administrator, Civil Service (Medical): No    Lack of Transportation (Non-Medical): No  Physical Activity: Sufficiently Active (05/12/2023)   Exercise Vital Sign    Days of Exercise per Week: 6 days    Minutes of Exercise per Session: 30 min  Stress: No Stress Concern Present (05/12/2023)   [employer] of Occupational Health - Occupational Stress Questionnaire    Feeling of Stress : Only a little  Social Connections: Socially Integrated (05/12/2023)   Social Connection and Isolation Panel [NHANES]    Frequency of Communication with Friends and Family: Three times a week    Frequency of Social Gatherings with Friends and Family: Twice a week    Attends Religious Services: 1 to 4 times per year    Active Member of [employer] or Organizations: Yes    Attends Banker Meetings: 1 to 4 times per year    Marital Status: Married    Current Medications:  Current Outpatient Medications:    levonorgestrel (KYLEENA) 19.5 MG IUD, 1 each by Intrauterine route once., Disp: , Rfl:    sertraline (ZOLOFT) 50 MG tablet, Take by mouth., Disp: , Rfl:    VITAMIN D PO, Take by mouth., Disp: , Rfl:   Review of Symptoms: Complete 10-system review is negative except as above in Interval History.  Physical Exam:  GU: External genitalia healing well from simple partial vulvectomy. Few sutures still intact. Bilateral medial labia minora well healed from CO2 laser ablation. No lesions. Exam chaperoned by Warner Mccreedy, NP There were no vitals taken for this visit. General: Alert, oriented, no acute  distress. HEENT: Normocephalic, atraumatic. Neck symmetric without masses. Sclera anicteric.  Chest: Normal work of breathing.  Extremities: Grossly normal range of motion.  Warm, well perfused.  No edema bilaterally. Skin: No rashes or lesions noted. Lymphatics: No inguinal adenopathy. GU: External genitalia well healed from prior simple partial vulvectomy, CO2 laser ablation. No lesions. Soft small scarring on perineal body. No agglutination. Mild erythema of bilateral medial labial posteriorly. Speculum exam reveals normal cervix, small dark brown discharge.  Bimanual exam reveals normal cervix and uterus.  Exam chaperoned by Andrey Cota, RN   Laboratory & Radiologic Studies: None

## 2023-05-24 ENCOUNTER — Encounter: Payer: Self-pay | Admitting: Psychiatry

## 2023-07-26 ENCOUNTER — Encounter: Payer: Self-pay | Admitting: Psychiatry

## 2023-07-26 ENCOUNTER — Other Ambulatory Visit: Payer: Self-pay | Admitting: Gynecologic Oncology

## 2023-07-26 DIAGNOSIS — N901 Moderate vulvar dysplasia: Secondary | ICD-10-CM

## 2023-07-26 DIAGNOSIS — R102 Pelvic and perineal pain: Secondary | ICD-10-CM

## 2023-07-26 DIAGNOSIS — N941 Unspecified dyspareunia: Secondary | ICD-10-CM

## 2023-07-26 DIAGNOSIS — D071 Carcinoma in situ of vulva: Secondary | ICD-10-CM

## 2023-07-26 DIAGNOSIS — N898 Other specified noninflammatory disorders of vagina: Secondary | ICD-10-CM

## 2023-07-26 MED ORDER — ESTRADIOL 0.1 MG/GM VA CREA: TOPICAL_CREAM | VAGINAL | 4 refills | Status: AC

## 2023-11-07 ENCOUNTER — Telehealth: Payer: Self-pay

## 2023-11-07 NOTE — Telephone Encounter (Signed)
Michelle Padilla called stating she has an upcoming appointment with Dr.Newton on 12/16. She would like to cancel w/o rescheduling. She states she spoke with her OB/Gyn and they are ok with her having follow up's with them.   Dr.Newton notified.

## 2023-11-10 ENCOUNTER — Ambulatory Visit: Payer: No Typology Code available for payment source | Admitting: Nurse Practitioner

## 2023-11-11 ENCOUNTER — Ambulatory Visit: Payer: No Typology Code available for payment source | Admitting: Nurse Practitioner

## 2023-11-14 ENCOUNTER — Inpatient Hospital Stay: Payer: No Typology Code available for payment source | Admitting: Psychiatry

## 2023-12-02 ENCOUNTER — Ambulatory Visit (INDEPENDENT_AMBULATORY_CARE_PROVIDER_SITE_OTHER): Payer: No Typology Code available for payment source | Admitting: Nurse Practitioner

## 2023-12-02 VITALS — BP 108/62 | HR 72 | Temp 98.3°F | Ht 64.0 in | Wt 143.5 lb

## 2023-12-02 DIAGNOSIS — F419 Anxiety disorder, unspecified: Secondary | ICD-10-CM

## 2023-12-02 DIAGNOSIS — D649 Anemia, unspecified: Secondary | ICD-10-CM

## 2023-12-02 LAB — CBC
HCT: 40.7 % (ref 36.0–46.0)
Hemoglobin: 13.5 g/dL (ref 12.0–15.0)
MCHC: 33.3 g/dL (ref 30.0–36.0)
MCV: 81.6 fL (ref 78.0–100.0)
Platelets: 174 10*3/uL (ref 150.0–400.0)
RBC: 4.99 Mil/uL (ref 3.87–5.11)
RDW: 14.3 % (ref 11.5–15.5)
WBC: 5.2 10*3/uL (ref 4.0–10.5)

## 2023-12-02 LAB — IRON: Iron: 107 ug/dL (ref 42–145)

## 2023-12-02 LAB — FERRITIN: Ferritin: 26.1 ng/mL (ref 10.0–291.0)

## 2023-12-02 MED ORDER — BUSPIRONE HCL 5 MG PO TABS: 5.0000 mg | ORAL_TABLET | Freq: Two times a day (BID) | ORAL | 6 refills | Status: AC | PRN

## 2023-12-02 NOTE — Assessment & Plan Note (Signed)
 Check iron, ferritin, and CBC today.  Further recommendations may be made based upon the results.

## 2023-12-02 NOTE — Progress Notes (Signed)
 Established Patient Office Visit  Subjective   Patient ID: Michelle Padilla, female    DOB: [birth year]  Age: 38 y.o. MRN: [id number]  Chief Complaint  Patient presents with   Anxiety    Anxiety: She reports she is discontinued the sertraline due to decreased libido.  Overall feels that her mood is stable but does still experience anxiety, and would like to discuss different medication options. IUD in place for contraception, no missed periods, LMP 11/08/23  Iron deficiency: No anemia on last labs, did have slightly low iron.  Encouraged to consider either iron supplement or multivitamin.  Patient reports she has not yet started this, but feels well.  Denies any blood in stool.  Reports history of always being borderline anemic.  Currently has IUD in place for contraception.     Review of Systems  Constitutional:  Negative for malaise/fatigue.  Respiratory:  Negative for shortness of breath.   Cardiovascular:  Negative for chest pain.  Gastrointestinal:  Negative for blood in stool.      Objective:     BP 108/62   Pulse 72   Temp 98.3 F (36.8 C) (Temporal)   Ht 5' 4 (1.626 m)   Wt 143 lb 8 oz (65.1 kg)   LMP 11/08/2023   SpO2 100%   Breastfeeding No   BMI 24.63 kg/m  BP Readings from Last 3 Encounters:  12/02/23 108/62  05/23/23 132/80  05/12/23 108/66   Wt Readings from Last 3 Encounters:  12/02/23 143 lb 8 oz (65.1 kg)  05/23/23 141 lb 3.2 oz (64 kg)  05/12/23 142 lb 6 oz (64.6 kg)         12/02/2023    1:05 PM 05/12/2023    1:04 PM 09/10/2022    1:12 PM  PHQ9 SCORE ONLY  PHQ-9 Total Score 0 0 0      12/02/2023    1:10 PM 05/12/2023    1:21 PM  GAD 7 : Generalized Anxiety Score  Nervous, Anxious, on Edge 1 2  Control/stop worrying 1 1  Worry too much - different things 1 1  Trouble relaxing 1 1  Restless 0 1  Easily annoyed or irritable 0 1  Afraid - awful might happen 1 1  Total GAD 7 Score 5 8      Physical Exam Vitals reviewed.   Constitutional:      General: She is not in acute distress.    Appearance: Normal appearance.  HENT:     Head: Normocephalic and atraumatic.  Cardiovascular:     Rate and Rhythm: Normal rate and regular rhythm.     Pulses: Normal pulses.     Heart sounds: Normal heart sounds.  Pulmonary:     Effort: Pulmonary effort is normal.     Breath sounds: Normal breath sounds.  Skin:    General: Skin is warm and dry.  Neurological:     General: No focal deficit present.     Mental Status: She is alert and oriented to person, place, and time.  Psychiatric:        Mood and Affect: Mood normal.        Behavior: Behavior normal.        Judgment: Judgment normal.      No results found for any visits on 12/02/23.    The ASCVD Risk score (Arnett DK, et al., 2019) failed to calculate for the following reasons:   The 2019 ASCVD risk score is only valid for ages 2  to 3    Assessment & Plan:   Problem List Items Addressed This Visit       Other   Anemia - Primary   Check iron, ferritin, and CBC today.  Further recommendations may be made based upon the results.      Relevant Orders   Iron   CBC   Ferritin   Anxiety   Chronic, improved but still suboptimally controlled Per shared decision making patient would like to trial BuSpar  5 to 10 mg by mouth twice a day as needed for anxiety.  We discussed potential side effects and blackbox warning of suicidal ideation. She feels comfortable reaching out to me if she experiences any unwanted side effects and/or issues with the medication.  Thus, we will plan on following up in 9 months at annual physical unless she reaches out sooner.      Relevant Medications   busPIRone  (BUSPAR ) 5 MG tablet    Return in about 9 months (around 08/31/2024) for CPE with Lauraine.    Lauraine FORBES Pereyra, NP

## 2023-12-02 NOTE — Assessment & Plan Note (Signed)
 Chronic, improved but still suboptimally controlled Per shared decision making patient would like to trial BuSpar  5 to 10 mg by mouth twice a day as needed for anxiety.  We discussed potential side effects and blackbox warning of suicidal ideation. She feels comfortable reaching out to me if she experiences any unwanted side effects and/or issues with the medication.  Thus, we will plan on following up in 9 months at annual physical unless she reaches out sooner.
